# Patient Record
Sex: Male | Born: 1970 | Race: White | Hispanic: No | Marital: Single | Smoking: Former smoker
Health system: Southern US, Community
[De-identification: ages and names within clinical notes are randomized; demographics above are authoritative.]

## PROBLEM LIST (undated history)

## (undated) ENCOUNTER — Emergency Department (HOSPITAL_COMMUNITY): Disposition: A | Discharge status: Home / Self Care | Payer: 59

## (undated) DIAGNOSIS — K259 Gastric ulcer, unspecified as acute or chronic, without hemorrhage or perforation: Secondary | ICD-10-CM

## (undated) DIAGNOSIS — K589 Irritable bowel syndrome without diarrhea: Secondary | ICD-10-CM

## (undated) DIAGNOSIS — K449 Diaphragmatic hernia without obstruction or gangrene: Secondary | ICD-10-CM

## (undated) DIAGNOSIS — E785 Hyperlipidemia, unspecified: Secondary | ICD-10-CM

## (undated) DIAGNOSIS — K8020 Calculus of gallbladder without cholecystitis without obstruction: Secondary | ICD-10-CM

## (undated) DIAGNOSIS — R42 Dizziness and giddiness: Principal | ICD-10-CM

## (undated) DIAGNOSIS — S32010A Wedge compression fracture of first lumbar vertebra, initial encounter for closed fracture: Secondary | ICD-10-CM

## (undated) DIAGNOSIS — E162 Hypoglycemia, unspecified: Secondary | ICD-10-CM

## (undated) DIAGNOSIS — K219 Gastro-esophageal reflux disease without esophagitis: Secondary | ICD-10-CM

## (undated) DIAGNOSIS — N159 Renal tubulo-interstitial disease, unspecified: Secondary | ICD-10-CM

## (undated) DIAGNOSIS — S129XXA Fracture of neck, unspecified, initial encounter: Secondary | ICD-10-CM

## (undated) DIAGNOSIS — Z8719 Personal history of other diseases of the digestive system: Secondary | ICD-10-CM

## (undated) DIAGNOSIS — F419 Anxiety disorder, unspecified: Secondary | ICD-10-CM

## (undated) DIAGNOSIS — M419 Scoliosis, unspecified: Secondary | ICD-10-CM

## (undated) DIAGNOSIS — G43019 Migraine without aura, intractable, without status migrainosus: Secondary | ICD-10-CM

## (undated) HISTORY — DX: Hypoglycemia, unspecified: E16.2

## (undated) HISTORY — DX: Anxiety disorder, unspecified: F41.9

## (undated) HISTORY — DX: Fracture of neck, unspecified, initial encounter: S12.9XXA

## (undated) HISTORY — DX: Personal history of other diseases of the digestive system: Z87.19

## (undated) HISTORY — DX: Calculus of gallbladder without cholecystitis without obstruction: K80.20

## (undated) HISTORY — DX: Renal tubulo-interstitial disease, unspecified: N15.9

## (undated) HISTORY — DX: Hyperlipidemia, unspecified: E78.5

## (undated) HISTORY — DX: Gastro-esophageal reflux disease without esophagitis: K21.9

## (undated) HISTORY — PX: CHOLECYSTECTOMY: SHX55

## (undated) HISTORY — PX: KNEE SURGERY: SHX244

## (undated) HISTORY — PX: HERNIA REPAIR: SHX51

## (undated) HISTORY — DX: Migraine without aura, intractable, without status migrainosus: G43.019

## (undated) HISTORY — DX: Gastric ulcer, unspecified as acute or chronic, without hemorrhage or perforation: K25.9

## (undated) HISTORY — DX: Dizziness and giddiness: R42

## (undated) HISTORY — DX: Diaphragmatic hernia without obstruction or gangrene: K44.9

## (undated) HISTORY — DX: Irritable bowel syndrome, unspecified: K58.9

## (undated) HISTORY — DX: Wedge compression fracture of first lumbar vertebra, initial encounter for closed fracture: S32.010A

## (undated) HISTORY — DX: Scoliosis, unspecified: M41.9

## (undated) HISTORY — PX: OTHER SURGICAL HISTORY: SHX169

## (undated) HISTORY — PX: KIDNEY STONE SURGERY: SHX686

---

## 2006-11-19 ENCOUNTER — Ambulatory Visit (HOSPITAL_BASED_OUTPATIENT_CLINIC_OR_DEPARTMENT_OTHER): Admission: RE | Admit: 2006-11-19 | Discharge: 2006-11-19 | Payer: Self-pay | Admitting: Urology

## 2007-05-21 ENCOUNTER — Emergency Department (HOSPITAL_COMMUNITY): Admission: EM | Admit: 2007-05-21 | Discharge: 2007-05-21 | Payer: Self-pay | Admitting: Emergency Medicine

## 2008-04-13 ENCOUNTER — Ambulatory Visit: Payer: Self-pay | Admitting: Cardiology

## 2008-04-22 ENCOUNTER — Encounter: Payer: Self-pay | Admitting: Cardiology

## 2008-04-22 ENCOUNTER — Ambulatory Visit: Payer: Self-pay

## 2008-07-28 ENCOUNTER — Encounter: Admission: RE | Admit: 2008-07-28 | Discharge: 2008-07-28 | Payer: Self-pay | Admitting: Family Medicine

## 2011-05-02 NOTE — Assessment & Plan Note (Signed)
[facility]                            CARDIOLOGY OFFICE NOTE   KHYRON, [name]                      MRN:          [id number]  DATE:04/13/2008                            DOB:          December 08, 1971    REFERRING PHYSICIAN:  Ernestina Penna, M.D.   REASON FOR CONSULTATION:  Chest pain.   HISTORY OF PRESENT ILLNESS:  Mr. Derrick Wheeler is a 40 year old male with a  history of tobacco abuse and some degree of cardiovascular disease  within the family affecting both parents in their 38s. He has no  personal history of hypertension, diabetes mellitus or cardiovascular  disease.  I see based on old the records that he underwent a previous  stress test in 2006 at an outside facility that was reportedly normal  per discussion with the patient.  He was having some chest pain at that  time that was ultimately felt to be musculoskeletal.  I also see that he  was arranged to be seen in our [place] office at that time, although he did  not come in for this visit.   He is now referred with a 4-week history of intermittent chest  discomfort.  He describes this as both a fluttering and a tight chest  discomfort that lasts anywhere from seconds to minutes, typically in the  left lower thoracic/costal area.  He states that this is most noted when  he is emotionally upset or under stress, although it has been sporadic  and unpredictable at other times.  He does not note any clear exertional  component to this.  He states he has not been exercising regularly.  He  has had no follow-up ischemic testing since 2006. Electrocardiogram  today is normal showing sinus rhythm at 77 beats per minute.   ALLERGIES:  OXYCONTIN and LATEX.   MEDICATIONS:  Ibuprofen 200 mg p.o. b.i.d.   PAST MEDICAL HISTORY:  As outlined above. He reports back problems.  He  has had previous surgery on the left wrist, left knee, right knee, left  testicle.  He had an epidural injection in his lower spine  in 2003.   REVIEW OF SYSTEMS:  As outlined above.  Otherwise negative.   SOCIAL HISTORY:  The patient is single.  He has one child.  He smokes a  pack per day tobacco and has done this for the last 4 years although he  was able to quit in the past.  He drinks 1 or 2 alcoholic beverages a  week, one cup of coffee a day.  He is not exercising regularly.   FAMILY HISTORY:  Significant for cardiovascular disease affecting both  mother and father in their 64s.  He also states that he has a brother in  his 50s with some type of cardiac condition.   PHYSICAL EXAMINATION:  Blood pressure is 110/84, heart rate 77, weight  190 pounds.  The patient is well developed in no acute distress.  HEENT:  Conjunctiva, lids normal.  Pharynx clear.  NECK:  Supple.  No elevated venous pressure. No loud bruits, no  thyromegaly is noted.  LUNGS:  Clear without labored breathing at rest.  CARDIAC:  Regular rate and rhythm.  No loud murmur or gallop.  ABDOMEN:  Soft, nontender, normal active bowel sounds.  EXTREMITIES:  No significant pitting edema.  Distal pulses are of 2+.  SKIN:  Warm and dry.  MUSCULOSKELETAL:  No kyphosis noted.  NEUROPSYCHIATRIC:  The patient alert and oriented x3.  Affect is  appropriate.   IMPRESSION/RECOMMENDATIONS:  Chest pain syndrome as outlined above in a  40 year old male with ongoing tobacco abuse and some degree of family  cardiovascular history.  His electrocardiogram at rest is normal.  He  has not had any follow-up ischemic evaluation since 2006, at that time  reportedly normal.  He is interested in resuming an exercise regiment.  I spoke with him about smoking cessation and also ultimately following  up for a lipid profile with Dr. Christell Constant.  I think an exercise  echocardiogram would be reasonable for further risk stratification and  this will be arranged. We will inform them of the results and can  proceed from there.     Jonelle Sidle, MD  Electronically  Signed    SGM/MedQ  DD: 04/13/2008  DT: 04/13/2008  Job #: [id number]

## 2011-05-05 NOTE — Op Note (Signed)
NAME:  Derrick Wheeler, Derrick Wheeler NO.:  [id number]   MEDICAL RECORD NO.:  [id number]          PATIENT TYPE:  AMB   LOCATION:  [facility]                         FACILITY:  [facility]   PHYSICIAN:  Maretta Bees. Vonita Moss, M.D.DATE OF BIRTH:  18-Nov-1971   DATE OF PROCEDURE:  11/19/2006  DATE OF DISCHARGE:                               OPERATIVE REPORT   PREOPERATIVE DIAGNOSIS:  Right spermatocele.   POSTOPERATIVE DIAGNOSIS:  Right spermatocele.   PROCEDURE:  Right partial epididymectomy.   SURGEON:  Maretta Bees. Vonita Moss, M.D.   ANESTHESIA:  General.   INDICATIONS:  This 40 year old gentleman has had recurrent pain and  tenderness from a small spermatocele in the upper pole of right appeared  epididymis.  He has been unresponsive to medical and conservative  therapy and wishes resection of this lesion.   PROCEDURE:  The patient was brought to the operating room and placed in  the supine position.  The external genitalia were prepped and draped in  the usual fashion.  A right scrotal incision was made and the testicle  delivered into the operative field, and the tunica vaginalis incised and  retracted behind the testicle. The cystic-feeling mass in the upper pole  of the right epididymis was palpated, and the upper pole was dissected  sharply off of the top of the epididymis with hemostasis using  electrocautery.  A segment of epididymis about an inch long was then  transected that included resection of the spermatocele the resected  epididymis was oversewn with running 3-0 chromic catgut, then this  epididymal remnant sutured over the previous resection site.  The tunica  vaginalis was sutured behind the testicle with 3-0 chromic catgut.  A  couple other tiny bleeders were electrocoagulated. At this point, the  scrotal wound was closed with a running vertical mattress suture of 3-0  chromic catgut with some reinforcing isolated sutures of 3-0 chromic  catgut.  The wound was then injected  with Marcaine, and collodion put on  the sutures.  He was taken to the recovery room in good condition, with  essentially minimal if any blood loss, and having tolerated the  procedure well.      Maretta Bees. Vonita Moss, M.D.  Electronically Signed     LJP/MEDQ  D:  11/19/2006  T:  11/20/2006  Job:  [id number]

## 2011-10-05 LAB — DIFFERENTIAL
Basophils Absolute: 0
Basophils Relative: 1
Eosinophils Absolute: 0.3
Eosinophils Relative: 4
Lymphocytes Relative: 28
Lymphs Abs: 1.9
Monocytes Absolute: 0.6
Monocytes Relative: 8
Neutro Abs: 4
Neutrophils Relative %: 59

## 2011-10-05 LAB — I-STAT 8, (EC8 V) (CONVERTED LAB)
Acid-base deficit: 1
BUN: 12
Bicarbonate: 25.1 — ABNORMAL HIGH
Chloride: 106
Glucose, Bld: 91
HCT: 46
Hemoglobin: 15.6
Operator id: 189501
Potassium: 3.7
Sodium: 139
TCO2: 26
pCO2, Ven: 44.9 — ABNORMAL LOW
pH, Ven: 7.355 — ABNORMAL HIGH

## 2011-10-05 LAB — CBC
HCT: 44.4
Hemoglobin: 14.9
MCHC: 33.6
MCV: 86.8
Platelets: 230
RBC: 5.12
RDW: 13.6
WBC: 6.8

## 2011-10-05 LAB — POCT CARDIAC MARKERS
CKMB, poc: <1 — ABNORMAL LOW
Myoglobin, poc: 30.6
Operator id: 189501
Troponin i, poc: <0.05

## 2011-10-05 LAB — POCT I-STAT CREATININE
Creatinine, Ser: 1.1
Operator id: 189501

## 2011-10-05 LAB — D-DIMER, QUANTITATIVE: D-Dimer, Quant: <0.22

## 2013-03-18 ENCOUNTER — Ambulatory Visit (INDEPENDENT_AMBULATORY_CARE_PROVIDER_SITE_OTHER): Payer: 59 | Admitting: General Practice

## 2013-03-18 ENCOUNTER — Ambulatory Visit (INDEPENDENT_AMBULATORY_CARE_PROVIDER_SITE_OTHER): Payer: 59

## 2013-03-18 VITALS — BP 126/68 | HR 72 | Temp 98.2°F | Wt 210.0 lb

## 2013-03-18 DIAGNOSIS — R059 Cough, unspecified: Secondary | ICD-10-CM

## 2013-03-18 DIAGNOSIS — R05 Cough: Secondary | ICD-10-CM

## 2013-03-18 DIAGNOSIS — R058 Other specified cough: Secondary | ICD-10-CM

## 2013-03-18 DIAGNOSIS — J329 Chronic sinusitis, unspecified: Secondary | ICD-10-CM

## 2013-03-18 MED ORDER — BENZONATATE 100 MG PO CAPS: 100.0000 mg | ORAL_CAPSULE | Freq: Two times a day (BID) | ORAL | Status: DC | PRN

## 2013-03-18 MED ORDER — AZITHROMYCIN 250 MG PO TABS: ORAL_TABLET | ORAL | Status: DC

## 2013-03-18 MED ORDER — CEFTRIAXONE SODIUM 1 G IJ SOLR: 1.0000 g | Freq: Once | INTRAMUSCULAR | Status: DC

## 2013-03-18 MED ORDER — CEFTRIAXONE SODIUM 1 G IJ SOLR
1.0000 g | Freq: Once | INTRAMUSCULAR | Status: AC
  Administered 2013-03-18: 1 g via INTRAMUSCULAR

## 2013-03-18 MED ORDER — AZITHROMYCIN 250 MG PO TABS
ORAL_TABLET | ORAL | Status: DC
Start: 2013-03-18 — End: 2013-03-18

## 2013-03-18 NOTE — Patient Instructions (Addendum)
Sinusitis Sinusitis is redness, soreness, and swelling (inflammation) of the paranasal sinuses. Paranasal sinuses are air pockets within the bones of your face (beneath the eyes, the middle of the forehead, or above the eyes). In healthy paranasal sinuses, mucus is able to drain out, and air is able to circulate through them by way of your nose. However, when your paranasal sinuses are inflamed, mucus and air can become trapped. This can allow bacteria and other germs to grow and cause infection. Sinusitis can develop quickly and last only a short time (acute) or continue over a long period (chronic). Sinusitis that lasts for more than 12 weeks is considered chronic.  CAUSES  Causes of sinusitis include:  Allergies.  Structural abnormalities, such as displacement of the cartilage that separates your nostrils (deviated septum), which can decrease the air flow through your nose and sinuses and affect sinus drainage.  Functional abnormalities, such as when the small hairs (cilia) that line your sinuses and help remove mucus do not work properly or are not present. SYMPTOMS  Symptoms of acute and chronic sinusitis are the same. The primary symptoms are pain and pressure around the affected sinuses. Other symptoms include:  Upper toothache.  Earache.  Headache.  Bad breath.  Decreased sense of smell and taste.  A cough, which worsens when you are lying flat.  Fatigue.  Fever.  Thick drainage from your nose, which often is green and may contain pus (purulent).  Swelling and warmth over the affected sinuses. DIAGNOSIS  Your caregiver will perform a physical exam. During the exam, your caregiver may:  Look in your nose for signs of abnormal growths in your nostrils (nasal polyps).  Tap over the affected sinus to check for signs of infection.  View the inside of your sinuses (endoscopy) with a special imaging device with a light attached (endoscope), which is inserted into your  sinuses. If your caregiver suspects that you have chronic sinusitis, one or more of the following tests may be recommended:  Allergy tests.  Nasal culture A sample of mucus is taken from your nose and sent to a lab and screened for bacteria.  Nasal cytology A sample of mucus is taken from your nose and examined by your caregiver to determine if your sinusitis is related to an allergy. TREATMENT  Most cases of acute sinusitis are related to a viral infection and will resolve on their own within 10 days. Sometimes medicines are prescribed to help relieve symptoms (pain medicine, decongestants, nasal steroid sprays, or saline sprays).  However, for sinusitis related to a bacterial infection, your caregiver will prescribe antibiotic medicines. These are medicines that will help kill the bacteria causing the infection.  Rarely, sinusitis is caused by a fungal infection. In theses cases, your caregiver will prescribe antifungal medicine. For some cases of chronic sinusitis, surgery is needed. Generally, these are cases in which sinusitis recurs more than 3 times per year, despite other treatments. HOME CARE INSTRUCTIONS   Drink plenty of water. Water helps thin the mucus so your sinuses can drain more easily.  Use a humidifier.  Inhale steam 3 to 4 times a day (for example, sit in the bathroom with the shower running).  Apply a warm, moist washcloth to your face 3 to 4 times a day, or as directed by your caregiver.  Use saline nasal sprays to help moisten and clean your sinuses.  Take over-the-counter or prescription medicines for pain, discomfort, or fever only as directed by your caregiver. SEEK IMMEDIATE MEDICAL   CARE IF:  You have increasing pain or severe headaches.  You have nausea, vomiting, or drowsiness.  You have swelling around your face.  You have vision problems.  You have a stiff neck.  You have difficulty breathing. MAKE SURE YOU:   Understand these  instructions.  Will watch your condition.  Will get help right away if you are not doing well or get worse. Document Released: 12/04/2005 Document Revised: 02/26/2012 Document Reviewed: 12/19/2011 [facility] Patient Information 2013 [place], [place]. Cough, Adult  A cough is a reflex that helps clear your throat and airways. It can help heal the body or may be a reaction to an irritated airway. A cough may only last 2 or 3 weeks (acute) or may last more than 8 weeks (chronic).  CAUSES Acute cough:  Viral or bacterial infections. Chronic cough:  Infections.  Allergies.  Asthma.  Post-nasal drip.  Smoking.  Heartburn or acid reflux.  Some medicines.  Chronic lung problems (COPD).  Cancer. SYMPTOMS   Cough.  Fever.  Chest pain.  Increased breathing rate.  High-pitched whistling sound when breathing (wheezing).  Colored mucus that you cough up (sputum). TREATMENT   A bacterial cough may be treated with antibiotic medicine.  A viral cough must run its course and will not respond to antibiotics.  Your caregiver may recommend other treatments if you have a chronic cough. HOME CARE INSTRUCTIONS   Only take over-the-counter or prescription medicines for pain, discomfort, or fever as directed by your caregiver. Use cough suppressants only as directed by your caregiver.  Use a cold steam vaporizer or humidifier in your bedroom or home to help loosen secretions.  Sleep in a semi-upright position if your cough is worse at night.  Rest as needed.  Stop smoking if you smoke. SEEK IMMEDIATE MEDICAL CARE IF:   You have pus in your sputum.  Your cough starts to worsen.  You cannot control your cough with suppressants and are losing sleep.  You begin coughing up blood.  You have difficulty breathing.  You develop pain which is getting worse or is uncontrolled with medicine.  You have a fever. MAKE SURE YOU:   Understand these instructions.  Will watch your  condition.  Will get help right away if you are not doing well or get worse. Document Released: 06/02/2011 Document Revised: 02/26/2012 Document Reviewed: 06/02/2011 [facility] Patient Information 2013 [place], [place].  Smoking Cessation Quitting smoking is important to your health and has many advantages. However, it is not always easy to quit since nicotine is a very addictive drug. Often times, people try 3 times or more before being able to quit. This document explains the best ways for you to prepare to quit smoking. Quitting takes hard work and a lot of effort, but you can do it. ADVANTAGES OF QUITTING SMOKING  You will live longer, feel better, and live better.  Your body will feel the impact of quitting smoking almost immediately.  Within 20 minutes, blood pressure decreases. Your pulse returns to its normal level.  After 8 hours, carbon monoxide levels in the blood return to normal. Your oxygen level increases.  After 24 hours, the chance of having a heart attack starts to decrease. Your breath, hair, and body stop smelling like smoke.  After 48 hours, damaged nerve endings begin to recover. Your sense of taste and smell improve.  After 72 hours, the body is virtually free of nicotine. Your bronchial tubes relax and breathing becomes easier.  After 2 to 12  weeks, lungs can hold more air. Exercise becomes easier and circulation improves.  The risk of having a heart attack, stroke, cancer, or lung disease is greatly reduced.  After 1 year, the risk of coronary heart disease is cut in half.  After 5 years, the risk of stroke falls to the same as a nonsmoker.  After 10 years, the risk of lung cancer is cut in half and the risk of other cancers decreases significantly.  After 15 years, the risk of coronary heart disease drops, usually to the level of a nonsmoker.  If you are pregnant, quitting smoking will improve your chances of having a healthy baby.  The people you live  with, especially any children, will be healthier.  You will have extra money to spend on things other than cigarettes. QUESTIONS TO THINK ABOUT BEFORE ATTEMPTING TO QUIT You may want to talk about your answers with your caregiver.  Why do you want to quit?  If you tried to quit in the past, what helped and what did not?  What will be the most difficult situations for you after you quit? How will you plan to handle them?  Who can help you through the tough times? Your family? Friends? A caregiver?  What pleasures do you get from smoking? What ways can you still get pleasure if you quit? Here are some questions to ask your caregiver:  How can you help me to be successful at quitting?  What medicine do you think would be best for me and how should I take it?  What should I do if I need more help?  What is smoking withdrawal like? How can I get information on withdrawal? GET READY  Set a quit date.  Change your environment by getting rid of all cigarettes, ashtrays, matches, and lighters in your home, car, or work. Do not let people smoke in your home.  Review your past attempts to quit. Think about what worked and what did not. GET SUPPORT AND ENCOURAGEMENT You have a better chance of being successful if you have help. You can get support in many ways.  Tell your family, friends, and co-workers that you are going to quit and need their support. Ask them not to smoke around you.  Get individual, group, or telephone counseling and support. Programs are available at [employer] and health centers. Call your local health department for information about programs in your area.  Spiritual beliefs and practices may help some smokers quit.  Download a "quit meter" on your computer to keep track of quit statistics, such as how long you have gone without smoking, cigarettes not smoked, and money saved.  Get a self-help book about quitting smoking and staying off of tobacco. LEARN  NEW SKILLS AND BEHAVIORS  Distract yourself from urges to smoke. Talk to someone, go for a walk, or occupy your time with a task.  Change your normal routine. Take a different route to work. Drink tea instead of coffee. Eat breakfast in a different place.  Reduce your stress. Take a hot bath, exercise, or read a book.  Plan something enjoyable to do every day. Reward yourself for not smoking.  Explore interactive web-based programs that specialize in helping you quit. GET MEDICINE AND USE IT CORRECTLY Medicines can help you stop smoking and decrease the urge to smoke. Combining medicine with the above behavioral methods and support can greatly increase your chances of successfully quitting smoking.  Nicotine replacement therapy helps deliver nicotine to your  body without the negative effects and risks of smoking. Nicotine replacement therapy includes nicotine gum, lozenges, inhalers, nasal sprays, and skin patches. Some may be available over-the-counter and others require a prescription.  Antidepressant medicine helps people abstain from smoking, but how this works is unknown. This medicine is available by prescription.  Nicotinic receptor partial agonist medicine simulates the effect of nicotine in your brain. This medicine is available by prescription. Ask your caregiver for advice about which medicines to use and how to use them based on your health history. Your caregiver will tell you what side effects to look out for if you choose to be on a medicine or therapy. Carefully read the information on the package. Do not use any other product containing nicotine while using a nicotine replacement product.  RELAPSE OR DIFFICULT SITUATIONS Most relapses occur within the first 3 months after quitting. Do not be discouraged if you start smoking again. Remember, most people try several times before finally quitting. You may have symptoms of withdrawal because your body is used to nicotine. You may  crave cigarettes, be irritable, feel very hungry, cough often, get headaches, or have difficulty concentrating. The withdrawal symptoms are only temporary. They are strongest when you first quit, but they will go away within 10 14 days. To reduce the chances of relapse, try to:  Avoid drinking alcohol. Drinking lowers your chances of successfully quitting.  Reduce the amount of caffeine you consume. Once you quit smoking, the amount of caffeine in your body increases and can give you symptoms, such as a rapid heartbeat, sweating, and anxiety.  Avoid smokers because they can make you want to smoke.  Do not let weight gain distract you. Many smokers will gain weight when they quit, usually less than 10 pounds. Eat a healthy diet and stay active. You can always lose the weight gained after you quit.  Find ways to improve your mood other than smoking. FOR MORE INFORMATION  www.smokefree.gov  Document Released: 11/28/2001 Document Revised: 06/04/2012 Document Reviewed: 03/14/2012 [facility] Patient Information 2013 [place], [place].

## 2013-03-18 NOTE — Progress Notes (Signed)
  Subjective:    Patient ID: Derrick Wheeler, male    DOB: 06-29-1971, 42 y.o.   MRN: [id number]  HPI Presents today with headache, chest congestion, cough, and facial pressure times one to two weeks. Productive sputum light brown-green. Facial pressure over forehead and cheek area. OTC medications not taken, only vitamin C. Denies fever    Review of Systems  Constitutional: Negative for fever, chills, activity change and appetite change.  Respiratory: Positive for chest tightness and stridor. Negative for shortness of breath.   Cardiovascular: Negative for chest pain and palpitations.  Genitourinary: Negative for difficulty urinating.  Musculoskeletal: Positive for myalgias.  Skin: Negative.  Negative for rash.  Neurological: Positive for dizziness, light-headedness and headaches.       With coughing  Psychiatric/Behavioral: Negative.        Objective:   Physical Exam  Constitutional: He is oriented to person, place, and time. He appears well-developed and well-nourished.  HENT:  Head: Normocephalic and atraumatic.  Right Ear: External ear normal.  Left Ear: External ear normal.  Nose: Right sinus exhibits maxillary sinus tenderness and frontal sinus tenderness. Left sinus exhibits maxillary sinus tenderness and frontal sinus tenderness.  Mouth/Throat: Posterior oropharyngeal erythema present.  Cardiovascular: Normal rate, regular rhythm and normal heart sounds.   No murmur heard. Pulmonary/Chest: Effort normal and breath sounds normal.  Abdominal: Soft. Bowel sounds are normal.  Neurological: He is alert and oriented to person, place, and time.  Skin: Skin is warm and dry.  Psychiatric: He has a normal mood and affect.    [facility] reading (PRIMARY) by Ruthell Rummage, FNP-C, no acute disease process                                    Assessment & Plan:  Rocephin 1gram given IM Continue oral antibiotics even if feeling better Increase fluid intake tylenol OTC OTC  decongestant Proper handwashing Rest  RTO if symptoms or unresolved Patient verbalized understanding and denies any questions    Raymon Mutton, FNP-C

## 2013-09-30 ENCOUNTER — Ambulatory Visit (INDEPENDENT_AMBULATORY_CARE_PROVIDER_SITE_OTHER): Payer: 59 | Admitting: Family Medicine

## 2013-09-30 ENCOUNTER — Ambulatory Visit (INDEPENDENT_AMBULATORY_CARE_PROVIDER_SITE_OTHER): Payer: 59

## 2013-09-30 ENCOUNTER — Encounter: Payer: Self-pay | Admitting: Family Medicine

## 2013-09-30 VITALS — BP 111/76 | HR 86 | Temp 98.2°F | Ht 74.0 in | Wt 207.0 lb

## 2013-09-30 DIAGNOSIS — S1096XA Insect bite of unspecified part of neck, initial encounter: Secondary | ICD-10-CM

## 2013-09-30 DIAGNOSIS — W57XXXA Bitten or stung by nonvenomous insect and other nonvenomous arthropods, initial encounter: Secondary | ICD-10-CM

## 2013-09-30 DIAGNOSIS — S00462A Insect bite (nonvenomous) of left ear, initial encounter: Secondary | ICD-10-CM

## 2013-09-30 DIAGNOSIS — R51 Headache: Secondary | ICD-10-CM

## 2013-09-30 DIAGNOSIS — M542 Cervicalgia: Secondary | ICD-10-CM

## 2013-09-30 MED ORDER — CEPHALEXIN 500 MG PO CAPS: 500.0000 mg | ORAL_CAPSULE | Freq: Three times a day (TID) | ORAL | Status: DC

## 2013-09-30 MED ORDER — NAPROXEN 500 MG PO TABS: 500.0000 mg | ORAL_TABLET | Freq: Two times a day (BID) | ORAL | Status: DC

## 2013-09-30 NOTE — Patient Instructions (Signed)
Continue current medications. Continue good therapeutic lifestyle changes.  Fall precautions discussed with patient. Follow up as planned and earlier as needed.   
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
## 2013-12-23 ENCOUNTER — Telehealth: Payer: Self-pay | Admitting: Family Medicine

## 2013-12-23 NOTE — Telephone Encounter (Signed)
Appt at 4 with moore 1/7

## 2013-12-24 ENCOUNTER — Encounter: Payer: Self-pay | Admitting: Family Medicine

## 2013-12-24 ENCOUNTER — Ambulatory Visit (INDEPENDENT_AMBULATORY_CARE_PROVIDER_SITE_OTHER): Payer: 59 | Admitting: Family Medicine

## 2013-12-24 VITALS — BP 101/74 | HR 103 | Temp 97.8°F | Ht 74.0 in | Wt 208.0 lb

## 2013-12-24 DIAGNOSIS — L738 Other specified follicular disorders: Secondary | ICD-10-CM

## 2013-12-24 DIAGNOSIS — B350 Tinea barbae and tinea capitis: Secondary | ICD-10-CM

## 2013-12-24 MED ORDER — SULFAMETHOXAZOLE-TMP DS 800-160 MG PO TABS: 1.0000 | ORAL_TABLET | Freq: Two times a day (BID) | ORAL | Status: DC

## 2013-12-24 NOTE — Progress Notes (Addendum)
   Subjective:    Patient ID: Derrick Wheeler, male    DOB: [birth year], 43 y.o.   MRN: [id number]  HPI Patient here today for infected area on face. The patient has had problems with this same area in the past and has had to take antibiotics.       There are no active problems to display for this patient.  Outpatient Encounter Prescriptions as of 12/24/2013  Medication Sig  . [DISCONTINUED] cephALEXin (KEFLEX) 500 MG capsule Take 1 capsule (500 mg total) by mouth 3 (three) times daily.  . [DISCONTINUED] naproxen (NAPROSYN) 500 MG tablet Take 1 tablet (500 mg total) by mouth 2 (two) times daily with a meal.    Review of Systems  Constitutional: Negative.   HENT: Negative.   Eyes: Negative.   Respiratory: Negative.   Cardiovascular: Negative.   Gastrointestinal: Negative.   Endocrine: Negative.   Genitourinary: Negative.   Musculoskeletal: Negative.   Skin: Negative.        Infected area on face  Allergic/Immunologic: Negative.   Neurological: Negative.   Hematological: Negative.   Psychiatric/Behavioral: Negative.        Objective:   Physical Exam  Constitutional: He is oriented to person, place, and time. He appears well-developed and well-nourished. No distress.  HENT:  Head: Normocephalic.  Eyes: Conjunctivae and EOM are normal. Pupils are equal, round, and reactive to light. Right eye exhibits no discharge. Left eye exhibits no discharge. No scleral icterus.  Neck: Normal range of motion. Neck supple. No thyromegaly present.  Musculoskeletal: Normal range of motion.  Lymphadenopathy:    He has no cervical adenopathy.  Neurological: He is alert and oriented to person, place, and time.  Skin: Skin is warm and dry. No rash noted. No pallor.  2 areas of inflammation 1 that is draining yellow material at the angle of the jaw or mandible in the anterior cervical area on the left side. The tissue is thickened in this area. There is minimal erythema.  Psychiatric: He has a  normal mood and affect. His behavior is normal. Judgment and thought content normal.   BP 101/74  Pulse 103  Temp(Src) 97.8 F (36.6 C) (Oral)  Ht 6\' 2"  (1.88 m)  Wt 208 lb (94.348 kg)  BMI 26.69 kg/m2        Assessment & Plan:  1. Folliculitis barbae - sulfamethoxazole-trimethoprim (BACTRIM DS) 800-160 MG per tablet; Take 1 tablet by mouth 2 (two) times daily.  Dispense: 60 tablet; Refill: 1  Patient Instructions  Use warm wet compresses followed by peroxide 2 or 3 times a day Take antibiotic hysterectomy with food We will arrange a followup appointment with a dermatologist in 3-4 weeks When you see the dermatologist, make sure you take a history of the culture that we're doing today and the antibiotic that you have been taking     Nyra Capeson W. Sukhman Kocher MD

## 2013-12-24 NOTE — Addendum Note (Signed)
Addended by: Orma RenderHODGES, Isael Stille F on: 12/24/2013 05:09 PM   Modules accepted: Orders

## 2013-12-24 NOTE — Patient Instructions (Addendum)
Use warm wet compresses followed by peroxide 2 or 3 times a day Take antibiotic hysterectomy with food We will arrange a followup appointment with a dermatologist in 3-4 weeks When you see the dermatologist, make sure you take a history of the culture that we're doing today and the antibiotic that you have been taking

## 2013-12-24 NOTE — Addendum Note (Signed)
Addended by: Magdalene RiverBULLINS, Sholonda Jobst H on: 12/24/2013 05:03 PM   Modules accepted: Orders

## 2013-12-26 LAB — AEROBIC CULTURE

## 2014-01-17 ENCOUNTER — Encounter: Payer: Self-pay | Admitting: Nurse Practitioner

## 2014-01-17 ENCOUNTER — Ambulatory Visit (INDEPENDENT_AMBULATORY_CARE_PROVIDER_SITE_OTHER): Payer: 59 | Admitting: Nurse Practitioner

## 2014-01-17 VITALS — BP 109/73 | HR 70 | Temp 97.3°F | Ht 74.0 in | Wt 209.0 lb

## 2014-01-17 DIAGNOSIS — L500 Allergic urticaria: Secondary | ICD-10-CM

## 2014-01-17 MED ORDER — METHYLPREDNISOLONE ACETATE 80 MG/ML IJ SUSP
80.0000 mg | Freq: Once | INTRAMUSCULAR | Status: AC
  Administered 2014-01-17: 80 mg via INTRAMUSCULAR

## 2014-01-17 NOTE — Patient Instructions (Signed)

## 2014-01-17 NOTE — Progress Notes (Signed)
   Subjective:    Patient ID: Derrick Wheeler, male    DOB: [birth year], 43 y.o.   MRN: [id number]  HPI Patient has been on sulfa for 3 weeks focillitis fro several days- Derrick Wheeler called in the middle of night saying Derrick Wheeler forgot to take one yesterday morning and took 2 within 2 hours yeasterday and woke up in the middle of night with rash. Very itchy    Review of Systems  Constitutional: Negative.   HENT: Negative.   Respiratory: Negative.   Cardiovascular: Negative.   Skin: Negative.   Neurological: Negative.   All other systems reviewed and are negative.       Objective:   Physical Exam  Constitutional: Derrick Wheeler is oriented to person, place, and time. Derrick Wheeler appears well-developed and well-nourished.  Cardiovascular: Normal rate, regular rhythm and normal heart sounds.   Pulmonary/Chest: Effort normal and breath sounds normal.  Abdominal: Soft. Bowel sounds are normal.  Neurological: Derrick Wheeler is alert and oriented to person, place, and time.  Skin: Skin is warm.  Fine erythematous itchy rash all over body  Psychiatric: Derrick Wheeler has a normal mood and affect. His behavior is normal. Judgment and thought content normal.    BP 109/73  Pulse 70  Temp(Src) 97.3 F (36.3 C) (Oral)  Ht 6\' 2"  (1.88 m)  Wt 209 lb (94.802 kg)  BMI 26.82 kg/m2       Assessment & Plan:   1. Allergic urticaria    Meds ordered this encounter  Medications  . methylPREDNISolone acetate (DEPO-MEDROL) injection 80 mg    Sig:    Benadryl OTC Follow up if no better  Mary-Margaret Daphine DeutscherMartin, FNP

## 2014-02-18 ENCOUNTER — Ambulatory Visit (INDEPENDENT_AMBULATORY_CARE_PROVIDER_SITE_OTHER): Payer: 59 | Admitting: Family Medicine

## 2014-02-18 ENCOUNTER — Encounter: Payer: Self-pay | Admitting: Family Medicine

## 2014-02-18 ENCOUNTER — Ambulatory Visit (INDEPENDENT_AMBULATORY_CARE_PROVIDER_SITE_OTHER): Payer: 59

## 2014-02-18 VITALS — BP 101/70 | HR 93 | Temp 98.0°F | Ht 74.0 in | Wt 208.0 lb

## 2014-02-18 DIAGNOSIS — R079 Chest pain, unspecified: Secondary | ICD-10-CM

## 2014-02-18 DIAGNOSIS — R5383 Other fatigue: Secondary | ICD-10-CM

## 2014-02-18 DIAGNOSIS — K219 Gastro-esophageal reflux disease without esophagitis: Secondary | ICD-10-CM

## 2014-02-18 DIAGNOSIS — R0789 Other chest pain: Secondary | ICD-10-CM

## 2014-02-18 DIAGNOSIS — R5381 Other malaise: Secondary | ICD-10-CM
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
## 2014-02-18 NOTE — Patient Instructions (Signed)
Discontinue caffeine Heavy lifting--- avoid Use warm wet compresses to back Stay away from fried greasy and highly spiced food Take Zantac 150 twice daily before breakfast and supper on a regular basis  Return to clinic and get lab work We will arrange for you to get a stress test Return the FOBT

## 2014-02-19 ENCOUNTER — Other Ambulatory Visit (INDEPENDENT_AMBULATORY_CARE_PROVIDER_SITE_OTHER): Payer: 59

## 2014-02-19 ENCOUNTER — Encounter (INDEPENDENT_AMBULATORY_CARE_PROVIDER_SITE_OTHER): Payer: Self-pay

## 2014-02-19 DIAGNOSIS — R5383 Other fatigue: Secondary | ICD-10-CM

## 2014-02-19 DIAGNOSIS — R079 Chest pain, unspecified: Secondary | ICD-10-CM

## 2014-02-19 DIAGNOSIS — R0789 Other chest pain: Secondary | ICD-10-CM

## 2014-02-19 DIAGNOSIS — R5381 Other malaise: Secondary | ICD-10-CM

## 2014-02-19 LAB — POCT CBC
Granulocyte percent: 60.8 %G (ref 37–80)
HCT, POC: 47.4 % (ref 43.5–53.7)
HEMOGLOBIN: 16 g/dL (ref 14.1–18.1)
Lymph, poc: 2.9 (ref 0.6–3.4)
MCH: 29.8 pg (ref 27–31.2)
MCHC: 33.7 g/dL (ref 31.8–35.4)
MCV: 88.3 fL (ref 80–97)
MPV: 7.3 fL (ref 0–99.8)
POC GRANULOCYTE: 4.8 (ref 2–6.9)
POC LYMPH %: 36.2 % (ref 10–50)
Platelet Count, POC: 234 10*3/uL (ref 142–424)
RBC: 5.4 M/uL (ref 4.69–6.13)
RDW, POC: 13.6 %
WBC: 7.9 10*3/uL (ref 4.6–10.2)

## 2014-02-19 NOTE — Addendum Note (Signed)
Addended by: Gwenith DailyHUDY, KRISTEN N on: 02/19/2014 08:38 AM   Modules accepted: Orders

## 2014-02-20 ENCOUNTER — Encounter: Payer: Self-pay | Admitting: Internal Medicine

## 2014-02-22 LAB — HEPATIC FUNCTION PANEL
ALBUMIN: 4.5 g/dL (ref 3.5–5.5)
ALT: 12 IU/L (ref 0–44)
AST: 15 IU/L (ref 0–40)
Alkaline Phosphatase: 69 IU/L (ref 39–117)
BILIRUBIN DIRECT: 0.14 mg/dL (ref 0.00–0.40)
BILIRUBIN TOTAL: 0.7 mg/dL (ref 0.0–1.2)
TOTAL PROTEIN: 7 g/dL (ref 6.0–8.5)

## 2014-02-22 LAB — NMR, LIPOPROFILE
Cholesterol: 231 mg/dL — ABNORMAL HIGH (ref <200)
HDL Cholesterol by NMR: 41 mg/dL (ref >=40)
HDL Particle Number: 22.4 umol/L — ABNORMAL LOW (ref >=30.5)
LDL PARTICLE NUMBER: 2027 nmol/L — AB (ref <1000)
LDL Size: 21 nm (ref >20.5)
LDLC SERPL CALC-MCNC: 164 mg/dL — AB (ref <100)
LP-IR SCORE: 66 — AB (ref <=45)
Small LDL Particle Number: 833 nmol/L — ABNORMAL HIGH (ref <=527)
Triglycerides by NMR: 128 mg/dL (ref <150)

## 2014-02-22 LAB — VITAMIN D 25 HYDROXY (VIT D DEFICIENCY, FRACTURES): Vit D, 25-Hydroxy: 14 ng/mL — ABNORMAL LOW (ref 30.0–100.0)

## 2014-02-22 LAB — THYROID PANEL WITH TSH
Free Thyroxine Index: 2.1 (ref 1.2–4.9)
T3 UPTAKE RATIO: 31 % (ref 24–39)
T4 TOTAL: 6.8 ug/dL (ref 4.5–12.0)
TSH: 0.754 u[IU]/mL (ref 0.450–4.500)

## 2014-02-22 LAB — BMP8+EGFR
BUN/Creatinine Ratio: 14 (ref 9–20)
BUN: 16 mg/dL (ref 6–24)
CALCIUM: 9.8 mg/dL (ref 8.7–10.2)
CO2: 24 mmol/L (ref 18–29)
CREATININE: 1.13 mg/dL (ref 0.76–1.27)
Chloride: 99 mmol/L (ref 97–108)
GFR calc Af Amer: 92 mL/min/{1.73_m2} (ref >59)
GFR, EST NON AFRICAN AMERICAN: 79 mL/min/{1.73_m2} (ref >59)
GLUCOSE: 92 mg/dL (ref 65–99)
Potassium: 4.5 mmol/L (ref 3.5–5.2)
Sodium: 141 mmol/L (ref 134–144)

## 2014-02-22 LAB — TESTOSTERONE,FREE AND TOTAL
TESTOSTERONE FREE: 7.7 pg/mL (ref 6.8–21.5)
Testosterone: 557 ng/dL (ref 348–1197)

## 2014-02-25 ENCOUNTER — Ambulatory Visit: Payer: 59

## 2014-03-03 ENCOUNTER — Encounter: Payer: Self-pay | Admitting: Pharmacist Clinician (PhC)/ Clinical Pharmacy Specialist

## 2014-03-03 ENCOUNTER — Ambulatory Visit (INDEPENDENT_AMBULATORY_CARE_PROVIDER_SITE_OTHER): Payer: 59

## 2014-03-03 ENCOUNTER — Encounter: Payer: Self-pay | Admitting: Family Medicine

## 2014-03-03 ENCOUNTER — Ambulatory Visit (INDEPENDENT_AMBULATORY_CARE_PROVIDER_SITE_OTHER): Payer: 59 | Admitting: Pharmacist Clinician (PhC)/ Clinical Pharmacy Specialist

## 2014-03-03 ENCOUNTER — Ambulatory Visit (INDEPENDENT_AMBULATORY_CARE_PROVIDER_SITE_OTHER): Payer: 59 | Admitting: Family Medicine

## 2014-03-03 VITALS — BP 103/74 | HR 71 | Temp 98.7°F | Ht 74.0 in | Wt 204.0 lb

## 2014-03-03 DIAGNOSIS — E785 Hyperlipidemia, unspecified: Secondary | ICD-10-CM

## 2014-03-03 DIAGNOSIS — M25519 Pain in unspecified shoulder: Secondary | ICD-10-CM

## 2014-03-03 DIAGNOSIS — S40012A Contusion of left shoulder, initial encounter: Secondary | ICD-10-CM

## 2014-03-03 DIAGNOSIS — S40019A Contusion of unspecified shoulder, initial encounter: Secondary | ICD-10-CM

## 2014-03-03 MED ORDER — NAPROXEN-ESOMEPRAZOLE 375-20 MG PO TBEC: DELAYED_RELEASE_TABLET | ORAL | Status: DC

## 2014-03-03 NOTE — Progress Notes (Signed)
   Subjective:    Patient ID: Derrick Wheeler, male    DOB: 12-28-1970, 43 y.o.   MRN: [id number]  HPI Comments: Patient present today for hyperlipdemia consult and smoking cessation.  He has a twin brother who eats healthy and exercises and has normal cholesterol with no statin treatment.  Mother who is 6273 had diagnosed CAD last year and it is unknown if she is taking a statin.  Patient has no prior history of high cholesterol.  He has been following a very high fat diet recently due to be a single dad and working long hours.  He has multiple knee surgeries and injuries that prevent him from running and doing impact exercises.  He does lift weights.     Review of Systems  Constitutional: Negative.   Eyes: Negative.   Cardiovascular: Negative.   Endocrine: Negative.   Allergic/Immunologic: Negative.   Neurological: Negative.   Hematological: Negative.   Psychiatric/Behavioral: Negative.        Objective:   Physical Exam  Constitutional: He is oriented to person, place, and time. He appears well-developed and well-nourished.  Cardiovascular: Normal rate and regular rhythm.   Neurological: He is alert and oriented to person, place, and time.  Skin: Skin is warm and dry.  Psychiatric: He has a normal mood and affect. His behavior is normal. Judgment and thought content normal.          Assessment & Plan:   Lipid Clinic Consultation  Chief Complaint:  No chief complaint on file.    Exam Regularity:  RRR Edema:  neg Respirations:  18   Carotid Bruits:  neg Xanthomas:  neg General Appearance:  alert, oriented, no acute distress Mood/Affect:  normal  HPI:  New onset hyperlidiemia     Component Value Date/Time   CHOL 231* 02/19/2014 0826    Assessment: CHD/CHF Risk Equivalents:  none [place] Estd 8256yrs risk:  Not calculated NCEP Risk Factors Present:  smoker, family history and low HDL Primary Problem(s):  LDL or LDL-P elevated and HDL or HDL-P decreased  Current  NCEP Goals: LDL Goal < 130 HDL Goal >/= 40 Tg Goal < 119150 Non-HDL Goal < 160  Secondary cause of hyperlipidemia present:  smoking Low fat diet followed?  No -fast food  Low carb diet followed?  No -   Exercise?  No - no aerobic exercise due to knee injuries  Recommendations: Changes in lipid medication(s):  None started today with do diet and exercise for 8 weeks  Recheck Lipid Panel:  8 weeks Other labs needed:     Time spent counseling patient:  45 min  Physician time spent with patient:  0 Referring Provider:  Christell ConstantMoore   PharmD:  [facility] Pharmacist

## 2014-03-03 NOTE — Patient Instructions (Signed)
Use ice off and on on the shoulder for the next 48 hours then warm compresses Take meds as directed, after eating
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
## 2014-03-04 ENCOUNTER — Ambulatory Visit: Payer: 59 | Admitting: Family Medicine

## 2014-03-10 ENCOUNTER — Encounter: Payer: Self-pay | Admitting: Family Medicine

## 2014-03-10 ENCOUNTER — Ambulatory Visit (INDEPENDENT_AMBULATORY_CARE_PROVIDER_SITE_OTHER): Payer: 59 | Admitting: Family Medicine

## 2014-03-10 VITALS — BP 118/76 | HR 78 | Temp 98.3°F | Ht 74.0 in | Wt 207.0 lb

## 2014-03-10 DIAGNOSIS — M25519 Pain in unspecified shoulder: Secondary | ICD-10-CM

## 2014-03-10 DIAGNOSIS — M25512 Pain in left shoulder: Secondary | ICD-10-CM

## 2014-03-10 MED ORDER — NAPROXEN 375 MG PO TABS
375.0000 mg | ORAL_TABLET | Freq: Two times a day (BID) | ORAL | Status: DC
Start: 2014-03-10 — End: 2014-04-02

## 2014-03-10 NOTE — Progress Notes (Signed)
   Subjective:    Patient ID: Derrick Wheeler, male    DOB: Jul 27, 1971, 43 y.o.   MRN: [id number]  HPI Patient here today for 1 week follow up of left shoulder pain.      There are no active problems to display for this patient.  Outpatient Encounter Prescriptions as of 03/10/2014  Medication Sig  . Naproxen-Esomeprazole (VIMOVO) 375-20 MG TBEC Take 1 pill twice daily after breakfast and supper  . nicotine (NICODERM CQ - DOSED IN MG/24 HOURS) 14 mg/24hr patch Place 14 mg onto the skin daily.  Melene Muller. [START ON 04/07/2014] nicotine (NICODERM CQ - DOSED IN MG/24 HR) 7 mg/24hr patch Place 7 mg onto the skin daily.    Review of Systems  Constitutional: Negative.   HENT: Negative.   Eyes: Negative.   Respiratory: Negative.   Cardiovascular: Negative.   Gastrointestinal: Negative.   Endocrine: Negative.   Genitourinary: Negative.   Musculoskeletal: Positive for arthralgias (left shoulder pain).  Skin: Negative.   Allergic/Immunologic: Negative.   Neurological: Negative.   Hematological: Negative.   Psychiatric/Behavioral: Negative.        Objective:   Physical Exam  Nursing note and vitals reviewed. Constitutional: He is oriented to person, place, and time. He appears well-developed and well-nourished. No distress.  HENT:  Head: Normocephalic.  Mouth/Throat: Oropharynx is clear and moist.  Eyes: Conjunctivae and EOM are normal. Pupils are equal, round, and reactive to light. Right eye exhibits no discharge. Left eye exhibits no discharge. No scleral icterus.  Neck: Normal range of motion.  Pulmonary/Chest: Effort normal.  Abdominal: Soft. Bowel sounds are normal. He exhibits no mass.  Musculoskeletal: He exhibits tenderness (tender in the glenohumeral joint area and proximal to the neck.).  Patient still has limited range of motion of left upper extremity with increased pain with abduction of shoulder and posterior movement of shoulder  Neurological: He is alert and oriented to  person, place, and time. He has normal reflexes.  Skin: Skin is warm and dry. No rash noted.  Psychiatric: He has a normal mood and affect. His behavior is normal. Judgment and thought content normal.   BP 118/76  Pulse 78  Temp(Src) 98.3 F (36.8 C) (Oral)  Ht 6\' 2"  (1.88 m)  Wt 207 lb (93.895 kg)  BMI 26.57 kg/m2        Assessment & Plan:   1. Left shoulder pain -MRI planned Patient Instructions  Range of motion exercises Warm wet compresses to shoulder Take Naprosyn 375 enteric-coated one twice daily after breakfast and supper along with the Zantac 150 twice a day before breakfast and supper, We will arrange for you to get an MRI of the shoulder We will recheck you in about 2 weeks    Nyra Capeson W. Anum Palecek MD

## 2014-03-10 NOTE — Patient Instructions (Signed)
Range of motion exercises Warm wet compresses to shoulder Take Naprosyn 375 enteric-coated one twice daily after breakfast and supper along with the Zantac 150 twice a day before breakfast and supper, We will arrange for you to get an MRI of the shoulder We will recheck you in about 2 weeks

## 2014-03-16 ENCOUNTER — Ambulatory Visit (HOSPITAL_COMMUNITY)
Admission: RE | Admit: 2014-03-16 | Discharge: 2014-03-16 | Disposition: A | Discharge status: Home / Self Care | Payer: 59 | Source: Ambulatory Visit | Attending: Family Medicine | Admitting: Family Medicine

## 2014-03-16 DIAGNOSIS — M719 Bursopathy, unspecified: Principal | ICD-10-CM | POA: Insufficient documentation

## 2014-03-16 DIAGNOSIS — M19019 Primary osteoarthritis, unspecified shoulder: Secondary | ICD-10-CM | POA: Insufficient documentation

## 2014-03-16 DIAGNOSIS — M25512 Pain in left shoulder: Secondary | ICD-10-CM

## 2014-03-16 DIAGNOSIS — M25519 Pain in unspecified shoulder: Secondary | ICD-10-CM | POA: Insufficient documentation

## 2014-03-16 DIAGNOSIS — M67919 Unspecified disorder of synovium and tendon, unspecified shoulder: Secondary | ICD-10-CM | POA: Insufficient documentation

## 2014-03-24 ENCOUNTER — Other Ambulatory Visit: Payer: Self-pay | Admitting: *Deleted

## 2014-03-24 DIAGNOSIS — M25519 Pain in unspecified shoulder: Secondary | ICD-10-CM

## 2014-04-01 ENCOUNTER — Encounter: Payer: Self-pay | Admitting: Internal Medicine

## 2014-04-02 ENCOUNTER — Ambulatory Visit (INDEPENDENT_AMBULATORY_CARE_PROVIDER_SITE_OTHER): Payer: 59 | Admitting: Nurse Practitioner

## 2014-04-02 VITALS — BP 107/71 | HR 81 | Temp 97.5°F

## 2014-04-02 DIAGNOSIS — R42 Dizziness and giddiness: Secondary | ICD-10-CM

## 2014-04-02 LAB — POCT CBC
Granulocyte percent: 64.3 %G (ref 37–80)
HCT, POC: 48.3 % (ref 43.5–53.7)
Hemoglobin: 15.4 g/dL (ref 14.1–18.1)
Lymph, poc: 2.3 (ref 0.6–3.4)
MCH, POC: 28.1 pg (ref 27–31.2)
MCHC: 32 g/dL (ref 31.8–35.4)
MCV: 87.8 fL (ref 80–97)
MPV: 7.4 fL (ref 0–99.8)
POC GRANULOCYTE: 4.6 (ref 2–6.9)
POC LYMPH %: 32.2 % (ref 10–50)
Platelet Count, POC: 239 10*3/uL (ref 142–424)
RBC: 5.5 M/uL (ref 4.69–6.13)
RDW, POC: 13.8 %
WBC: 7.1 10*3/uL (ref 4.6–10.2)

## 2014-04-02 LAB — GLUCOSE, POCT (MANUAL RESULT ENTRY): POC GLUCOSE: 185 mg/dL — AB (ref 70–99)

## 2014-04-02 NOTE — Progress Notes (Signed)
   Subjective:    Patient ID: Derrick Wheeler, male    DOB: 1971/08/29, 43 y.o.   MRN: [id number]  HPI Patient presents today complaining of dizziness and chest pain that began about an hour ago. States he was playing around with his son when he became lightheaded and dizzy. Was given a glass of orange juice. Has had several meal today and states he feels better since arriving, just feels tired. Denies headache, dizziness, or chest pain currently.    Review of Systems  Respiratory: Negative for shortness of breath.   Cardiovascular: Negative for chest pain and palpitations.  Neurological: Negative for dizziness, light-headedness and headaches.  All other systems reviewed and are negative.      Objective:   Physical Exam  Constitutional: He is oriented to person, place, and time. He appears well-developed and well-nourished.  Cardiovascular: Normal rate, regular rhythm and normal heart sounds.   Pulmonary/Chest: Effort normal and breath sounds normal.  Neurological: He is alert and oriented to person, place, and time.  Skin: Skin is warm and dry.  Psychiatric: He has a normal mood and affect. His behavior is normal. Judgment and thought content normal.      BP 107/71  Pulse 81  Temp(Src) 97.5 F (36.4 C) (Oral)  Results for orders placed in visit on 04/02/14  GLUCOSE, POCT (MANUAL RESULT ENTRY)      Result Value Ref Range   POC Glucose 185 (*) 70 - 99 mg/dl   EKG: NSR Preliminary reading by Ronnald Collum, FNP  [facility]     Assessment & Plan:   1. Dizziness and giddiness    Orders Placed This Encounter  Procedures  . BMP8+EGFR  . POCT glucose (manual entry)  . POCT CBC  . EKG 12-Lead   Rest  Force fluids Will call with lab results RTC if symptoms do not improve or worsen  Mary-Margaret Hassell Done, FNP

## 2014-04-02 NOTE — Patient Instructions (Signed)

## 2014-04-03 ENCOUNTER — Ambulatory Visit: Payer: Self-pay | Admitting: Internal Medicine

## 2014-04-03 LAB — BMP8+EGFR
BUN/Creatinine Ratio: 9 (ref 9–20)
BUN: 10 mg/dL (ref 6–24)
CALCIUM: 9.7 mg/dL (ref 8.7–10.2)
CHLORIDE: 100 mmol/L (ref 97–108)
CO2: 23 mmol/L (ref 18–29)
Creatinine, Ser: 1.14 mg/dL (ref 0.76–1.27)
GFR calc non Af Amer: 78 mL/min/{1.73_m2} (ref >59)
GFR, EST AFRICAN AMERICAN: 91 mL/min/{1.73_m2} (ref >59)
GLUCOSE: 182 mg/dL — AB (ref 65–99)
Potassium: 4.2 mmol/L (ref 3.5–5.2)
Sodium: 140 mmol/L (ref 134–144)

## 2014-04-06 ENCOUNTER — Telehealth: Payer: Self-pay | Admitting: *Deleted

## 2014-04-06 NOTE — Telephone Encounter (Signed)
Aware of results. 

## 2014-04-06 NOTE — Telephone Encounter (Signed)
Message copied by Almeta MonasSTONE, Yetunde Leis M on Mon Apr 06, 2014  9:34 AM ------      Message from: Bennie PieriniMARTIN, MARY-MARGARET      Created: Fri Apr 03, 2014  1:41 PM       All labs are normal      Blood sugar was non fasting so is normal at 185 ------

## 2014-05-25 ENCOUNTER — Ambulatory Visit: Payer: 59 | Admitting: Internal Medicine

## 2014-07-24 ENCOUNTER — Encounter: Payer: Self-pay | Admitting: *Deleted

## 2014-07-30 ENCOUNTER — Ambulatory Visit: Payer: 59 | Admitting: Internal Medicine

## 2014-09-16 ENCOUNTER — Ambulatory Visit (INDEPENDENT_AMBULATORY_CARE_PROVIDER_SITE_OTHER): Payer: 59 | Admitting: Family Medicine

## 2014-09-16 ENCOUNTER — Telehealth: Payer: Self-pay | Admitting: Family Medicine

## 2014-09-16 ENCOUNTER — Encounter: Payer: Self-pay | Admitting: Family Medicine

## 2014-09-16 VITALS — BP 102/70 | HR 79 | Temp 98.4°F | Wt 205.0 lb

## 2014-09-16 DIAGNOSIS — K2100 Gastro-esophageal reflux disease with esophagitis, without bleeding: Secondary | ICD-10-CM

## 2014-09-16 DIAGNOSIS — R072 Precordial pain: Secondary | ICD-10-CM

## 2014-09-16 DIAGNOSIS — R739 Hyperglycemia, unspecified: Secondary | ICD-10-CM

## 2014-09-16 DIAGNOSIS — R5383 Other fatigue: Secondary | ICD-10-CM

## 2014-09-16 DIAGNOSIS — R5381 Other malaise: Secondary | ICD-10-CM

## 2014-09-16 DIAGNOSIS — R7309 Other abnormal glucose: Secondary | ICD-10-CM

## 2014-09-16 DIAGNOSIS — E162 Hypoglycemia, unspecified: Secondary | ICD-10-CM

## 2014-09-16 LAB — POCT URINALYSIS DIPSTICK
Bilirubin, UA: NEGATIVE
Blood, UA: NEGATIVE
Glucose, UA: NEGATIVE
Ketones, UA: NEGATIVE
Leukocytes, UA: NEGATIVE
Nitrite, UA: NEGATIVE
Protein, UA: NEGATIVE
Spec Grav, UA: 1.015
Urobilinogen, UA: NEGATIVE
pH, UA: 6

## 2014-09-16 LAB — POCT UA - MICROSCOPIC ONLY
Bacteria, U Microscopic: NEGATIVE
Casts, Ur, LPF, POC: NEGATIVE
Crystals, Ur, HPF, POC: NEGATIVE
Epithelial cells, urine per micros: NEGATIVE
Mucus, UA: NEGATIVE
RBC, urine, microscopic: NEGATIVE
WBC, Ur, HPF, POC: NEGATIVE
Yeast, UA: NEGATIVE

## 2014-09-16 LAB — POCT CBC
Granulocyte percent: 65.4 %G (ref 37–80)
HCT, POC: 40 % — AB (ref 43.5–53.7)
Hemoglobin: 15.9 g/dL (ref 14.1–18.1)
Lymph, poc: 2.5 (ref 0.6–3.4)
MCH, POC: 34.7 pg — AB (ref 27–31.2)
MCHC: 39.9 g/dL — AB (ref 31.8–35.4)
MCV: 87 fL (ref 80–97)
MPV: 7.4 fL (ref 0–99.8)
POC Granulocyte: 5.1 (ref 2–6.9)
POC LYMPH PERCENT: 31.5 %L (ref 10–50)
Platelet Count, POC: 216 10*3/uL (ref 142–424)
RBC: 4.6 M/uL — AB (ref 4.69–6.13)
RDW, POC: 12.8 %
WBC: 7.8 10*3/uL (ref 4.6–10.2)

## 2014-09-16 LAB — GLUCOSE, POCT (MANUAL RESULT ENTRY): POC Glucose: 98 mg/dl (ref 70–99)

## 2014-09-16 LAB — POCT GLYCOSYLATED HEMOGLOBIN (HGB A1C): Hemoglobin A1C: 5.2

## 2014-09-16 MED ORDER — OMEPRAZOLE 20 MG PO CPDR: 20.0000 mg | DELAYED_RELEASE_CAPSULE | Freq: Every day | ORAL | Status: DC

## 2014-09-16 NOTE — Progress Notes (Signed)
   Subjective:    Patient ID: Derrick Wheeler, male    DOB: 01-27-1971, 43 y.o.   MRN: [id number]  HPI C/o fatigue, chest tightness, GERD, low blood sugar.  He is c/o substernal chest tightness.  He had episode of syncope a week ago and is seeing cardiology.  He had the syncopal episode when having diarrhea and GI sx's.  He passed out after having frequent BM's.  He is having hypoglycemia episodes. Review of chart shows hx of elevated blood sugars.     Review of Systems C/o chest pain, hypoglycemia, syncope, fatigue No  SOB, HA, dizziness, vision change, N/V, diarrhea, constipation, dysuria, urinary urgency or frequency, myalgias, arthralgias or rash.     Objective:   Physical Exam Vital signs noted  Well developed well nourished male.  HEENT - Head atraumatic Normocephalic                Eyes - PERRLA, Conjuctiva - clear Sclera- Clear EOMI                Ears - EAC's Wnl TM's Wnl Gross Hearing WNL                Throat - oropharanx wnl Respiratory - Lungs CTA bilateral Cardiac - RRR S1 and S2 without murmur GI - Abdomen soft Nontender and bowel sounds active x 4 Extremities - No edema. Neuro - Grossly intact.  EKG - NSR without acute ST-T changes.     Assessment & Plan:  Hypoglycemia - Plan: POCT glucose (manual entry), POCT glycosylated hemoglobin (Hb A1C)  Gastroesophageal reflux disease with esophagitis - Plan: omeprazole (PRILOSEC) 20 MG capsule  Precordial pain - Plan: POCT glycosylated hemoglobin (Hb A1C), EKG 12-Lead  Other malaise and fatigue - Plan: POCT glycosylated hemoglobin (Hb A1C), POCT urinalysis dipstick, POCT UA - Microscopic Only, TSH, Vitamin B12, POCT CBC, CMP14+EGFR  Hyperglycemia - Plan: POCT glycosylated hemoglobin (Hb A1C)  Lysbeth Penner FNP

## 2014-09-16 NOTE — Telephone Encounter (Signed)
Coming in now

## 2014-09-17 LAB — CMP14+EGFR
ALT: 14 IU/L (ref 0–44)
AST: 14 IU/L (ref 0–40)
Albumin/Globulin Ratio: 1.7 (ref 1.1–2.5)
Albumin: 4.5 g/dL (ref 3.5–5.5)
Alkaline Phosphatase: 66 IU/L (ref 39–117)
BUN/Creatinine Ratio: 11 (ref 9–20)
BUN: 15 mg/dL (ref 6–24)
CO2: 27 mmol/L (ref 18–29)
Calcium: 9.7 mg/dL (ref 8.7–10.2)
Chloride: 98 mmol/L (ref 97–108)
Creatinine, Ser: 1.37 mg/dL — ABNORMAL HIGH (ref 0.76–1.27)
GFR calc Af Amer: 72 mL/min/{1.73_m2} (ref >59)
GFR calc non Af Amer: 62 mL/min/{1.73_m2} (ref >59)
Globulin, Total: 2.6 g/dL (ref 1.5–4.5)
Glucose: 81 mg/dL (ref 65–99)
Potassium: 5 mmol/L (ref 3.5–5.2)
Sodium: 139 mmol/L (ref 134–144)
Total Bilirubin: 0.3 mg/dL (ref 0.0–1.2)
Total Protein: 7.1 g/dL (ref 6.0–8.5)

## 2014-09-17 LAB — TSH: TSH: 1.01 u[IU]/mL (ref 0.450–4.500)

## 2014-09-17 LAB — VITAMIN B12: Vitamin B-12: 358 pg/mL (ref 211–946)

## 2014-09-22 ENCOUNTER — Encounter: Payer: Self-pay | Admitting: Pharmacist Clinician (PhC)/ Clinical Pharmacy Specialist

## 2014-09-22 ENCOUNTER — Ambulatory Visit (INDEPENDENT_AMBULATORY_CARE_PROVIDER_SITE_OTHER): Payer: 59 | Admitting: Pharmacist Clinician (PhC)/ Clinical Pharmacy Specialist

## 2014-09-22 DIAGNOSIS — E860 Dehydration: Secondary | ICD-10-CM

## 2014-09-22 DIAGNOSIS — E785 Hyperlipidemia, unspecified: Secondary | ICD-10-CM

## 2014-09-22 NOTE — Progress Notes (Signed)
Discussed eating options to help avoid hypoglycemia episodes and reviewed patients current dietary habits and made recommendations.  Patient has not had lipids re-checked since my appointment with him 02/2014.  He will come in fasting next week to have his lipids re-checked and serum creatinine checked since is was elevated on 9/30.  Patient is alert, pleasant, and well groomed in no apparent distress.  His blood pressure is normal and no tachydardia or irregular heart beats are heard.

## 2015-02-04 ENCOUNTER — Ambulatory Visit: Payer: Self-pay | Admitting: Family Medicine

## 2015-02-04 ENCOUNTER — Other Ambulatory Visit (INDEPENDENT_AMBULATORY_CARE_PROVIDER_SITE_OTHER): Payer: 59

## 2015-02-04 DIAGNOSIS — K2100 Gastro-esophageal reflux disease with esophagitis, without bleeding: Secondary | ICD-10-CM

## 2015-02-04 DIAGNOSIS — Z0000 Encounter for general adult medical examination without abnormal findings: Secondary | ICD-10-CM

## 2015-02-04 DIAGNOSIS — E785 Hyperlipidemia, unspecified: Secondary | ICD-10-CM

## 2015-02-04 DIAGNOSIS — R079 Chest pain, unspecified: Secondary | ICD-10-CM

## 2015-02-04 NOTE — Progress Notes (Signed)
LAB ONLY 

## 2015-02-05 LAB — CBC WITH DIFFERENTIAL/PLATELET
BASOS ABS: 0 10*3/uL (ref 0.0–0.2)
Basos: 1 %
Eos: 5 %
Eosinophils Absolute: 0.3 10*3/uL (ref 0.0–0.4)
HCT: 46.1 % (ref 37.5–51.0)
Hemoglobin: 16 g/dL (ref 12.6–17.7)
Immature Grans (Abs): 0 10*3/uL (ref 0.0–0.1)
Immature Granulocytes: 0 %
LYMPHS ABS: 2.3 10*3/uL (ref 0.7–3.1)
LYMPHS: 38 %
MCH: 29.4 pg (ref 26.6–33.0)
MCHC: 34.7 g/dL (ref 31.5–35.7)
MCV: 85 fL (ref 79–97)
Monocytes Absolute: 0.6 10*3/uL (ref 0.1–0.9)
Monocytes: 9 %
Neutrophils Absolute: 2.8 10*3/uL (ref 1.4–7.0)
Neutrophils Relative %: 47 %
Platelets: 255 10*3/uL (ref 150–379)
RBC: 5.44 x10E6/uL (ref 4.14–5.80)
RDW: 13.6 % (ref 12.3–15.4)
WBC: 6 10*3/uL (ref 3.4–10.8)

## 2015-02-05 LAB — NMR, LIPOPROFILE
CHOLESTEROL: 245 mg/dL — AB (ref 100–199)
HDL Cholesterol by NMR: 46 mg/dL (ref >39)
HDL Particle Number: 28.5 umol/L — ABNORMAL LOW (ref >=30.5)
LDL PARTICLE NUMBER: 2521 nmol/L — AB (ref <1000)
LDL Size: 20.8 nm (ref >20.5)
LDL-C: 174 mg/dL — AB (ref 0–99)
LP-IR Score: 80 — ABNORMAL HIGH (ref <=45)
SMALL LDL PARTICLE NUMBER: 1323 nmol/L — AB (ref <=527)
TRIGLYCERIDES BY NMR: 126 mg/dL (ref 0–149)

## 2015-02-05 LAB — BMP8+EGFR
BUN/Creatinine Ratio: 12 (ref 9–20)
BUN: 13 mg/dL (ref 6–24)
CO2: 21 mmol/L (ref 18–29)
CREATININE: 1.05 mg/dL (ref 0.76–1.27)
Calcium: 9.5 mg/dL (ref 8.7–10.2)
Chloride: 102 mmol/L (ref 97–108)
GFR calc Af Amer: 99 mL/min/{1.73_m2} (ref >59)
GFR, EST NON AFRICAN AMERICAN: 86 mL/min/{1.73_m2} (ref >59)
GLUCOSE: 98 mg/dL (ref 65–99)
Potassium: 4.2 mmol/L (ref 3.5–5.2)
Sodium: 141 mmol/L (ref 134–144)

## 2015-02-05 LAB — HEPATIC FUNCTION PANEL
ALT: 21 IU/L (ref 0–44)
AST: 19 IU/L (ref 0–40)
Albumin: 4.5 g/dL (ref 3.5–5.5)
Alkaline Phosphatase: 66 IU/L (ref 39–117)
BILIRUBIN, DIRECT: 0.1 mg/dL (ref 0.00–0.40)
Bilirubin Total: 0.4 mg/dL (ref 0.0–1.2)
Total Protein: 7.2 g/dL (ref 6.0–8.5)

## 2015-02-05 LAB — VITAMIN D 25 HYDROXY (VIT D DEFICIENCY, FRACTURES): Vit D, 25-Hydroxy: 22.9 ng/mL — ABNORMAL LOW (ref 30.0–100.0)

## 2015-02-09 ENCOUNTER — Other Ambulatory Visit: Payer: Self-pay | Admitting: *Deleted

## 2015-02-09 MED ORDER — ROSUVASTATIN CALCIUM 10 MG PO TABS
10.0000 mg | ORAL_TABLET | Freq: Every day | ORAL | Status: DC
Start: 2015-02-09 — End: 2016-02-14

## 2015-02-11 ENCOUNTER — Ambulatory Visit: Payer: 59

## 2015-02-15 ENCOUNTER — Ambulatory Visit (INDEPENDENT_AMBULATORY_CARE_PROVIDER_SITE_OTHER): Payer: 59

## 2015-02-15 ENCOUNTER — Encounter: Payer: Self-pay | Admitting: Family Medicine

## 2015-02-15 ENCOUNTER — Ambulatory Visit (INDEPENDENT_AMBULATORY_CARE_PROVIDER_SITE_OTHER): Payer: 59 | Admitting: Family Medicine

## 2015-02-15 ENCOUNTER — Encounter: Payer: Self-pay | Admitting: *Deleted

## 2015-02-15 VITALS — BP 101/71 | HR 83 | Temp 97.1°F | Ht 74.0 in | Wt 212.0 lb

## 2015-02-15 DIAGNOSIS — R1013 Epigastric pain: Secondary | ICD-10-CM

## 2015-02-15 DIAGNOSIS — Z8489 Family history of other specified conditions: Secondary | ICD-10-CM | POA: Diagnosis not present

## 2015-02-15 DIAGNOSIS — R0789 Other chest pain: Secondary | ICD-10-CM

## 2015-02-15 DIAGNOSIS — E785 Hyperlipidemia, unspecified: Secondary | ICD-10-CM | POA: Diagnosis not present

## 2015-02-15 DIAGNOSIS — R0602 Shortness of breath: Secondary | ICD-10-CM

## 2015-02-15 DIAGNOSIS — Z8379 Family history of other diseases of the digestive system: Secondary | ICD-10-CM

## 2015-02-15 MED ORDER — OMEPRAZOLE 40 MG PO CPDR: 40.0000 mg | DELAYED_RELEASE_CAPSULE | Freq: Two times a day (BID) | ORAL | Status: DC

## 2015-02-15 MED ORDER — MELOXICAM 15 MG PO TABS: 15.0000 mg | ORAL_TABLET | Freq: Every day | ORAL | Status: DC

## 2015-02-15 NOTE — Patient Instructions (Addendum)
The patient should avoid heavy lifting pushing or pulling until the chest wall pain is improved He should take the meloxicam 15 mg 1 daily after breakfast for 1 week and then reduce this to one half after breakfast. If he develops any rectal bleeding are increased GI discomfort he should discontinue this completely. He should increase his omeprazole to 40 mg twice daily one half hour before breakfast and supper. We will schedule him for an endoscopy because of the epigastric pain. He should continue to work with his diet and his cholesterol issues. He should hold the Crestor until we get the chest pain improved. He should continue with his diet. He should continue not to smoke. Use a cool mist humidifier at home and take Mucinex maximum strength, blue and white in color one twice daily for cough and congestion with a large glass of water

## 2015-02-15 NOTE — Progress Notes (Signed)
Subjective:    Patient ID: Derrick Wheeler, male    DOB: [birth year], 44 y.o.   MRN: [id number]  HPI Pt here for follow up on chest pains and SOB. He has recently seen cardiology and has had extensive work ups. The patient did have an extensive workup for this chest pain which included an echo cardiogram and EKG and a catheterization. He was told by the cardiologist that his coronary arteries were clear. He says he has this continued chest pain in the front and from the back almost continuously and it is worse with movement. History wise, he did give a history of moving some very heavy equipment on rollers prior to the onset of all this chest discomfort. He has also been told by one of the physicians that he needed to have an endoscopy because of continued epigastric pain. He still complains of shortness of breath and dizziness. He says his chest hurts substernally and around both sides.        There are no active problems to display for this patient.  Outpatient Encounter Prescriptions as of 02/15/2015  Medication Sig  . Multiple Vitamin (MULTIVITAMIN WITH MINERALS) TABS tablet Take by mouth.  Marland Kitchen omeprazole (PRILOSEC) 20 MG capsule Take 1 capsule (20 mg total) by mouth daily.  . rosuvastatin (CRESTOR) 10 MG tablet Take 1 tablet (10 mg total) by mouth daily. (Patient not taking: Reported on 02/15/2015)    Review of Systems  Constitutional: Negative.   HENT: Negative.   Eyes: Negative.   Respiratory: Positive for chest tightness and shortness of breath.   Cardiovascular: Positive for chest pain.  Gastrointestinal: Negative.   Endocrine: Negative.   Genitourinary: Negative.   Musculoskeletal: Positive for arthralgias (chest area pains and rib pain).  Skin: Negative.   Allergic/Immunologic: Negative.   Neurological: Negative.   Hematological: Negative.   Psychiatric/Behavioral: Negative.        Objective:   Physical Exam  Constitutional: He is oriented to person, place, and time.  He appears well-developed and well-nourished. He appears distressed.  HENT:  Head: Normocephalic and atraumatic.  Right Ear: External ear normal.  Left Ear: External ear normal.  Mouth/Throat: Oropharynx is clear and moist. No oropharyngeal exudate.  Nasal congestion  Eyes: Conjunctivae and EOM are normal. Pupils are equal, round, and reactive to light. Right eye exhibits no discharge. Left eye exhibits no discharge. No scleral icterus.  Neck: Normal range of motion. Neck supple. No thyromegaly present.  Cardiovascular: Normal rate, regular rhythm and normal heart sounds.   No murmur heard. The rhythm is regular at 72/m  Pulmonary/Chest: Effort normal and breath sounds normal. No respiratory distress. He has no wheezes. He has no rales. He exhibits no tenderness.  The chest is clear anteriorly and posteriorly and there is no pleural rubs.  Abdominal: Soft. Bowel sounds are normal. He exhibits no mass. There is tenderness. There is no rebound and no guarding.  There is epigastric tenderness.  Musculoskeletal: Normal range of motion. He exhibits no edema or tenderness.  The patient has sore his chest wall size and sternal area.  Lymphadenopathy:    He has no cervical adenopathy.  Neurological: He is alert and oriented to person, place, and time.  Skin: Skin is warm and dry. No rash noted. No erythema. No pallor.  Psychiatric: He has a normal mood and affect. His behavior is normal. Judgment and thought content normal.  Increased anxiety  Nursing note and vitals reviewed.  BP 101/71 mmHg  Pulse 83  Temp(Src) 97.1 F (36.2 C) (Oral)  Ht 6\' 2"  (1.88 m)  Wt 212 lb (96.163 kg)  BMI 27.21 kg/m2  SpO2 98%  [facility] reading (PRIMARY) by  Dr. Tracie HarrierMoore-chest x-ray, no active disease                                      Assessment & Plan:  1. Shortness of breath -Take Mucinex twice daily with a large glass of water - DG Chest 2 View; Future - Ambulatory referral to Gastroenterology  2.  Chest tightness -Avoid heavy lifting pushing or pulling and use warm wet compresses to chest wall and take anti-inflammatory medicine as directed - Ambulatory referral to Gastroenterology  3. Family history of hiatal hernia -Increase omeprazole to 40 twice daily while taking the meloxicam - Ambulatory referral to Gastroenterology  4. Abdominal pain, epigastric -Take omeprazole 40 mg twice daily one half hour before breakfast and supper  5. Chest wall pain -Take meloxicam as directed  6. Hyperlipidemia -Continue to work aggressively with diet and start Crestor when chest pain is diminishing  Meds ordered this encounter  Medications  . Multiple Vitamin (MULTIVITAMIN WITH MINERALS) TABS tablet    Sig: Take by mouth.  Marland Kitchen. omeprazole (PRILOSEC) 40 MG capsule    Sig: Take 1 capsule (40 mg total) by mouth 2 (two) times daily.    Dispense:  60 capsule    Refill:  3  . meloxicam (MOBIC) 15 MG tablet    Sig: Take 1 tablet (15 mg total) by mouth daily.    Dispense:  30 tablet    Refill:  1   Patient Instructions  The patient should avoid heavy lifting pushing or pulling until the chest wall pain is improved He should take the meloxicam 15 mg 1 daily after breakfast for 1 week and then reduce this to one half after breakfast. If he develops any rectal bleeding are increased GI discomfort he should discontinue this completely. He should increase his omeprazole to 40 mg twice daily one half hour before breakfast and supper. We will schedule him for an endoscopy because of the epigastric pain. He should continue to work with his diet and his cholesterol issues. He should hold the Crestor until we get the chest pain improved. He should continue with his diet. He should continue not to smoke. Use a cool mist humidifier at home and take Mucinex maximum strength, blue and white in color one twice daily for cough and congestion with a large glass of water   Nyra Capeson W. Varsha Knock MD

## 2015-02-16 ENCOUNTER — Emergency Department (HOSPITAL_COMMUNITY): Payer: 59

## 2015-02-16 ENCOUNTER — Telehealth: Payer: Self-pay | Admitting: Family Medicine

## 2015-02-16 ENCOUNTER — Other Ambulatory Visit: Payer: Self-pay | Admitting: Family Medicine

## 2015-02-16 ENCOUNTER — Encounter (HOSPITAL_COMMUNITY): Payer: Self-pay | Admitting: Emergency Medicine

## 2015-02-16 DIAGNOSIS — K219 Gastro-esophageal reflux disease without esophagitis: Secondary | ICD-10-CM | POA: Diagnosis not present

## 2015-02-16 DIAGNOSIS — M419 Scoliosis, unspecified: Secondary | ICD-10-CM | POA: Diagnosis not present

## 2015-02-16 DIAGNOSIS — R0602 Shortness of breath: Secondary | ICD-10-CM | POA: Insufficient documentation

## 2015-02-16 DIAGNOSIS — J449 Chronic obstructive pulmonary disease, unspecified: Secondary | ICD-10-CM

## 2015-02-16 DIAGNOSIS — Z87891 Personal history of nicotine dependence: Secondary | ICD-10-CM | POA: Diagnosis not present

## 2015-02-16 DIAGNOSIS — R079 Chest pain, unspecified: Secondary | ICD-10-CM | POA: Diagnosis not present

## 2015-02-16 LAB — CBC WITH DIFFERENTIAL/PLATELET
BASOS ABS: 0 10*3/uL (ref 0.0–0.1)
BASOS PCT: 0 % (ref 0–1)
EOS ABS: 0.2 10*3/uL (ref 0.0–0.7)
Eosinophils Relative: 3 % (ref 0–5)
HCT: 46.2 % (ref 39.0–52.0)
HEMOGLOBIN: 16.2 g/dL (ref 13.0–17.0)
Lymphocytes Relative: 36 % (ref 12–46)
Lymphs Abs: 3.1 10*3/uL (ref 0.7–4.0)
MCH: 29.5 pg (ref 26.0–34.0)
MCHC: 35.1 g/dL (ref 30.0–36.0)
MCV: 84.2 fL (ref 78.0–100.0)
MONOS PCT: 10 % (ref 3–12)
Monocytes Absolute: 0.8 10*3/uL (ref 0.1–1.0)
NEUTROS PCT: 51 % (ref 43–77)
Neutro Abs: 4.3 10*3/uL (ref 1.7–7.7)
Platelets: 254 10*3/uL (ref 150–400)
RBC: 5.49 MIL/uL (ref 4.22–5.81)
RDW: 12.4 % (ref 11.5–15.5)
WBC: 8.5 10*3/uL (ref 4.0–10.5)

## 2015-02-16 LAB — I-STAT CHEM 8, ED
BUN: 14 mg/dL (ref 6–23)
CALCIUM ION: 1.17 mmol/L (ref 1.12–1.23)
CHLORIDE: 102 mmol/L (ref 96–112)
Creatinine, Ser: 1.1 mg/dL (ref 0.50–1.35)
GLUCOSE: 102 mg/dL — AB (ref 70–99)
HCT: 51 % (ref 39.0–52.0)
Hemoglobin: 17.3 g/dL — ABNORMAL HIGH (ref 13.0–17.0)
Potassium: 3.9 mmol/L (ref 3.5–5.1)
Sodium: 140 mmol/L (ref 135–145)
TCO2: 22 mmol/L (ref 0–100)

## 2015-02-16 LAB — I-STAT TROPONIN, ED: Troponin i, poc: 0 ng/mL (ref 0.00–0.08)

## 2015-02-16 NOTE — Telephone Encounter (Signed)
Pt aware of cxr results and orders

## 2015-02-16 NOTE — ED Notes (Signed)
Pt st's he has had mid chest pain x's 3 months.  Pt st's he had a clean cardiac cath on 2/10.  Pt also c/o  Shortness of breath

## 2015-02-17 ENCOUNTER — Emergency Department (HOSPITAL_COMMUNITY)
Admission: EM | Admit: 2015-02-17 | Discharge: 2015-02-17 | Disposition: A | Discharge status: Home / Self Care | Payer: 59 | Attending: Emergency Medicine | Admitting: Emergency Medicine

## 2015-02-17 ENCOUNTER — Emergency Department (HOSPITAL_COMMUNITY): Payer: 59

## 2015-02-17 ENCOUNTER — Encounter (HOSPITAL_COMMUNITY): Payer: Self-pay

## 2015-02-17 DIAGNOSIS — R079 Chest pain, unspecified: Secondary | ICD-10-CM

## 2015-02-17 MED ORDER — DEXAMETHASONE SODIUM PHOSPHATE 10 MG/ML IJ SOLN
10.0000 mg | Freq: Once | INTRAMUSCULAR | Status: AC
  Administered 2015-02-17: 10 mg via INTRAVENOUS
  Filled 2015-02-17: qty 1

## 2015-02-17 MED ORDER — IOHEXOL 350 MG/ML SOLN
100.0000 mL | Freq: Once | INTRAVENOUS | Status: AC | PRN
  Administered 2015-02-17: 100 mL via INTRAVENOUS

## 2015-02-17 NOTE — ED Provider Notes (Signed)
CSN: [id number]     Arrival date & time 02/16/15  2103 History  This chart was scribed for Dione Booze, MD by Bronson Curb, ED Scribe. This patient was seen in room D30C/D30C and the patient's care was started at 12:37 AM.    Chief Complaint  Patient presents with  . Chest Pain    The history is provided by the patient. No language interpreter was used.     HPI Comments: Derrick Wheeler is a 44 y.o. male who presents to the Emergency Department complaining of constant, sharp, burning, achy, central chest pain that radiates across the right anterior chest to the back, for the past 3 weeks, but has gotten worse in the last 24 hours He currently rates his pain as 4-5/10, and a 5-6/10 at its worse. There is associated SOB and notes the chest pain is worse with movement. Patient was seen 2 days ago, by PCP, for the same and reports he was prescribed a new medication (meloxicam) for chest tightness. Patient suspects this to be the cause of his worsening chest pain. He states, after taking this medication, he began feeling "bad" with decreased appetite and rash, in addition to worsening chest pain. Patient states he has since stopped taking this medication after speaking with PCP. He reports having a cardiac catheterization and ECG approximately 3 weeks ago, January 27, 2014, with normal findings. He denies diaphoresis, nausea, or vomiting. Patient is a former ppd smoker for the past 8-9 years, but recently quit 1 month ago.  PCP: Dr. Christell Constant at [facility]  Past Medical History  Diagnosis Date  . Scoliosis   . GERD (gastroesophageal reflux disease)    Past Surgical History  Procedure Laterality Date  . Removal of testicular mass    . Knee surgery     Family History  Problem Relation Age of Onset  . Heart disease Mother   . Asthma Mother   . Arthritis Mother   . Heart disease Father   . Mental illness Sister    History  Substance Use Topics  . Smoking status:  Former Smoker -- 1.00 packs/day for 10 years    Types: Cigarettes    Quit date: 02/11/2013  . Smokeless tobacco: Never Used  . Alcohol Use: No    Review of Systems  Respiratory: Positive for shortness of breath.   Cardiovascular: Positive for chest pain.  All other systems reviewed and are negative.     Allergies  Meloxicam; Codeine; and Sulfa antibiotics  Home Medications   Prior to Admission medications   Medication Sig Start Date End Date Taking? Authorizing Provider  meloxicam (MOBIC) 15 MG tablet Take 1 tablet (15 mg total) by mouth daily. 02/15/15  Yes Ernestina Penna, MD  Multiple Vitamin (MULTIVITAMIN WITH MINERALS) TABS tablet Take 1 tablet by mouth daily.    Yes Historical Provider, MD  Omega-3 Fatty Acids (FISH OIL PO) Take 1 tablet by mouth every other day.   Yes Historical Provider, MD  omeprazole (PRILOSEC) 40 MG capsule Take 1 capsule (40 mg total) by mouth 2 (two) times daily. 02/15/15  Yes Ernestina Penna, MD  rosuvastatin (CRESTOR) 10 MG tablet Take 1 tablet (10 mg total) by mouth daily. Patient not taking: Reported on 02/15/2015 02/09/15   Ernestina Penna, MD   Triage Vitals: BP 111/78 mmHg  Pulse 82  Resp 16  Ht  (1.88 m)  Wt 215 lb (97.523 kg)  BMI 27.59 kg/m2  SpO2 98%  Physical Exam  Constitutional: He is oriented to person, place, and time. He appears well-developed and well-nourished. No distress.  HENT:  Head: Normocephalic and atraumatic.  Eyes: Conjunctivae and EOM are normal. Pupils are equal, round, and reactive to light.  Neck: Normal range of motion. Neck supple. No JVD present.  Cardiovascular: Normal rate, regular rhythm and normal heart sounds.   No murmur heard. Pulmonary/Chest: Effort normal and breath sounds normal. He has no wheezes. He has no rales. He exhibits no tenderness.  Abdominal: Bowel sounds are normal. He exhibits no distension and no mass. There is no tenderness.  Musculoskeletal: Normal range of motion. He exhibits no  edema.  Lymphadenopathy:    He has no cervical adenopathy.  Neurological: He is alert and oriented to person, place, and time. No cranial nerve deficit. Coordination normal.  Skin: Skin is warm and dry. No rash noted.  Psychiatric: He has a normal mood and affect. His behavior is normal. Judgment and thought content normal.  Nursing note and vitals reviewed.   ED Course  Procedures (including critical care time)  DIAGNOSTIC STUDIES: Oxygen Saturation is 98% on room air, normal by my interpretation.    COORDINATION OF CARE: At [id number] Discussed treatment plan with patient which includes imaging. Patient agrees.    Labs Review Labs Reviewed  I-STAT CHEM 8, ED - Abnormal; Notable for the following:    Glucose, Bld 102 (*)    Hemoglobin 17.3 (*)    All other components within normal limits  CBC WITH DIFFERENTIAL/PLATELET  Rosezena SensorI-STAT TROPOININ, ED    Imaging Review Dg Chest 2 View  02/16/2015   CLINICAL DATA:  Three-month history of chest pain  EXAM: CHEST  2 VIEW  COMPARISON:  February 15, 2015  FINDINGS: There is a degree of underlying emphysematous change, stable. The interstitium is overall prominent but stable, probably reflecting chronic inflammatory type change. There is no frank edema or consolidation. Heart size and pulmonary vascularity are normal. No adenopathy. No bone lesions.  IMPRESSION: Underlying emphysematous change.  No edema or consolidation.   Electronically Signed   By: Bretta BangWilliam  Woodruff III M.D.   On: 02/16/2015 22:03   Dg Chest 2 View  02/15/2015   CLINICAL DATA:  Three weeks of chest pain  EXAM: CHEST  2 VIEW  COMPARISON:  PA and lateral chest of January 16, 2015 and February 18, 2014.  FINDINGS: The lungs are mildly hyperinflated. The interstitial markings are coarse. The heart and pulmonary vascularity are normal. The mediastinum is normal in width. There is no pleural effusion or pneumothorax. The bony thorax is unremarkable.  IMPRESSION: COPD. Coarse increased lung  markings may reflect the patient's smoking history with superimposed acute bronchitic change. There is no pneumonia nor other acute cardiopulmonary abnormality. If the patient's symptoms persist and remain unexplained, chest CT scanning may be useful.   Electronically Signed   By: Tyjah Hai  [place]   On: 02/15/2015 10:10   Ct Angio Chest Pe W/cm &/or Wo Cm  02/17/2015   CLINICAL DATA:  Subacute onset of mid chest pain, radiating to the ribs, for 2-3 weeks. Shortness of breath. Initial encounter.  EXAM: CT ANGIOGRAPHY CHEST WITH CONTRAST  TECHNIQUE: Multidetector CT imaging of the chest was performed using the standard protocol during bolus administration of intravenous contrast. Multiplanar CT image reconstructions and MIPs were obtained to evaluate the vascular anatomy.  CONTRAST:  100mL OMNIPAQUE IOHEXOL 350 MG/ML SOLN  COMPARISON:  Chest radiograph performed 02/16/2015  FINDINGS: There is no evidence of  pulmonary embolus.  Minimal bibasilar atelectasis is noted. The lungs are otherwise clear. There is no evidence of significant focal consolidation, pleural effusion or pneumothorax. No masses are identified; no abnormal focal contrast enhancement is seen.  The mediastinum is unremarkable in appearance. No mediastinal lymphadenopathy is seen. No pericardial effusion is identified. The great vessels are grossly unremarkable in appearance. Residual thymic tissue is within normal limits. No axillary lymphadenopathy is seen. The visualized portions of the thyroid gland are unremarkable in appearance.  The visualized portions of the liver and spleen are unremarkable. The visualized portions of the pancreas, gallbladder, stomach, adrenal glands and kidneys are within normal limits.  No acute osseous abnormalities are seen.  Review of the MIP images confirms the above findings.  IMPRESSION: 1. No evidence of pulmonary embolus. 2. Minimal bibasilar atelectasis noted; lungs otherwise clear.   Electronically Signed   By:  Roanna Raider M.D.   On: 02/17/2015 02:49   Images viewed by me.   EKG Interpretation   Date/Time:  Tuesday February 16 2015 21:10:18 EST Ventricular Rate:  89 PR Interval:  160 QRS Duration: 96 QT Interval:  346 QTC Calculation: 420 R Axis:   73 Text Interpretation:  Normal sinus rhythm Normal ECG Confirmed by OTTER   MD, OLGA ([id number]) on 02/16/2015 11:57:57 PM      MDM   Final diagnoses:  Chest pain, unspecified chest pain type                  Chest pain of uncertain cause. Current symptoms seem to be made worse with meloxicam. His PCP had planned to get a CT scan to look for other causes of his pain. He will be sent for CT angiogram to also rule out pulmonary embolus.  CT angiogram is unremarkable. He will be given a dose of dexamethasone prior to discharge and is referred back to his PCP. He is advised to stop taking meloxicam. His problems may be gastrointestinal in origin and he may need referral to a gastroenterologist.  I personally performed the services described in this documentation, which was scribed in my presence. The recorded information has been reviewed and is accurate.      Dione Booze, MD 02/17/15 [phone]

## 2015-02-17 NOTE — ED Notes (Signed)
Patient transported to CT 

## 2015-02-17 NOTE — Discharge Instructions (Signed)
Your evaluation in the emergency department did not show a clear cause for your pain. Please follow-up with your primary care provider. He may need referral to a gastroenterologist for endoscopy. Stop taking meloxicam since this seems to make her symptoms worse.

## 2015-02-23 ENCOUNTER — Ambulatory Visit (INDEPENDENT_AMBULATORY_CARE_PROVIDER_SITE_OTHER): Payer: 59 | Admitting: Nurse Practitioner

## 2015-02-23 ENCOUNTER — Encounter: Payer: Self-pay | Admitting: Nurse Practitioner

## 2015-02-23 VITALS — BP 100/80 | HR 64 | Ht 74.0 in | Wt 212.2 lb

## 2015-02-23 DIAGNOSIS — K219 Gastro-esophageal reflux disease without esophagitis: Secondary | ICD-10-CM

## 2015-02-23 DIAGNOSIS — R0789 Other chest pain: Secondary | ICD-10-CM

## 2015-02-23 NOTE — Progress Notes (Signed)
HPI :  Patient is a 44 year old male, referred by PCP Dr. Rudi Heap for evaluation of chest pain. Patient has been experiencing chest discomfort for several months. He had a normal cardiac catheterization ([facility]) last month . Patient was started on meloxicam for possible musculoskeletal pain.  Chest pain became acutely worse after one day of meloxicam and patient went to ED on 02/17/15 where EKG was read as normal sinus rhythm. CT angiogram negative for pulmonary embolism. Troponin negative. WBC and hgb normal.   Patient also gives a several month history of GERD manifested as heartburn.  Symptoms have improved with recent dietary changes. In addition, PPI was doubled several days ago and that has further improved things but he still has chest discomfort at times. Patient endorses occasional solid food dysphagia. No weight loss. He recently stop smoking.   Patient gives a history of irritable bowel syndrome, mainly in the form of loose stools. He would sometimes have minor rectal bleeding with bowel movements. His bowel movements have completely normalized and no further bleeding since recent dietary modifications. Patient has no family history of colon cancer.   Past Medical History  Diagnosis Date  . Scoliosis   . GERD (gastroesophageal reflux disease)     Family History  Problem Relation Age of Onset  . Heart disease Mother   . Asthma Mother   . Arthritis Mother   . Heart disease Father   . Mental illness Sister    History  Substance Use Topics  . Smoking status: Former Smoker -- 1.00 packs/day for 10 years    Types: Cigarettes    Quit date: 02/11/2013  . Smokeless tobacco: Never Used  . Alcohol Use: No   Current Outpatient Prescriptions  Medication Sig Dispense Refill  . omeprazole (PRILOSEC) 40 MG capsule Take 1 capsule (40 mg total) by mouth 2 (two) times daily. 60 capsule 3  . Multiple Vitamin (MULTIVITAMIN WITH MINERALS) TABS tablet Take 1 tablet by mouth daily.     .  Omega-3 Fatty Acids (FISH OIL PO) Take 1 tablet by mouth every other day.    . rosuvastatin (CRESTOR) 10 MG tablet Take 1 tablet (10 mg total) by mouth daily. (Patient not taking: Reported on 02/15/2015) 90 tablet 3   No current facility-administered medications for this visit.   Allergies  Allergen Reactions  . Meloxicam Shortness Of Breath and Rash  . Codeine Rash  . Sulfa Antibiotics Rash     Review of Systems: Positive for back pain, vision changes, cough, muscle pain, shortness of breath. All other systems reviewed and negative except where noted in HPI.    Dg Chest 2 View  02/16/2015   CLINICAL DATA:  Three-month history of chest pain  EXAM: CHEST  2 VIEW  COMPARISON:  February 15, 2015  FINDINGS: There is a degree of underlying emphysematous change, stable. The interstitium is overall prominent but stable, probably reflecting chronic inflammatory type change. There is no frank edema or consolidation. Heart size and pulmonary vascularity are normal. No adenopathy. No bone lesions.  IMPRESSION: Underlying emphysematous change.  No edema or consolidation.   Electronically Signed   By: Bretta Bang III M.D.   On: 02/16/2015 22:03    Ct Angio Chest Pe W/cm &/or Wo Cm  02/17/2015   CLINICAL DATA:  Subacute onset of mid chest pain, radiating to the ribs, for 2-3 weeks. Shortness of breath. Initial encounter.  EXAM: CT ANGIOGRAPHY CHEST WITH CONTRAST  TECHNIQUE: Multidetector CT imaging of the chest  was performed using the standard protocol during bolus administration of intravenous contrast. Multiplanar CT image reconstructions and MIPs were obtained to evaluate the vascular anatomy.  CONTRAST:  100mL OMNIPAQUE IOHEXOL 350 MG/ML SOLN  COMPARISON:  Chest radiograph performed 02/16/2015  FINDINGS: There is no evidence of pulmonary embolus.  Minimal bibasilar atelectasis is noted. The lungs are otherwise clear. There is no evidence of significant focal consolidation, pleural effusion or  pneumothorax. No masses are identified; no abnormal focal contrast enhancement is seen.  The mediastinum is unremarkable in appearance. No mediastinal lymphadenopathy is seen. No pericardial effusion is identified. The great vessels are grossly unremarkable in appearance. Residual thymic tissue is within normal limits. No axillary lymphadenopathy is seen. The visualized portions of the thyroid gland are unremarkable in appearance.  The visualized portions of the liver and spleen are unremarkable. The visualized portions of the pancreas, gallbladder, stomach, adrenal glands and kidneys are within normal limits.  No acute osseous abnormalities are seen.  Review of the MIP images confirms the above findings.  IMPRESSION: 1. No evidence of pulmonary embolus. 2. Minimal bibasilar atelectasis noted; lungs otherwise clear.   Electronically Signed   By: Roanna RaiderJeffery  Chang M.D.   On: 02/17/2015 02:49    Physical Exam: BP 100/80 mmHg  Pulse 64  Ht 6\' 2"  (1.88 m)  Wt 212 lb 3.2 oz (96.253 kg)  BMI 27.23 kg/m2 Constitutional: Pleasant,well-developed, white male in no acute distress. HEENT: Normocephalic and atraumatic. Conjunctivae are normal. No scleral icterus. Neck supple.  Cardiovascular: Normal rate, regular rhythm.  Pulmonary/chest: Effort normal and breath sounds normal. No wheezing, rales or rhonchi. Abdominal: Soft, nondistended, nontender. Bowel sounds active throughout. There are no masses palpable. No hepatomegaly. Extremities: no edema Lymphadenopathy: No cervical adenopathy noted. Neurological: Alert and oriented to person place and time. Skin: Skin is warm and dry. No rashes noted. Psychiatric: Normal mood and affect. Behavior is normal.   ASSESSMENT AND PLAN:  591. 44 year old male with several month history of chest pain. Cardiac workup negative. CT angiogram negative for PE. LFTs normal. I do think patient has GERD but his chest pain seems musculoskeletal in origin though meloxicam worsened  the discomfort.  He is feeling a little better after increasing PPI.    Antireflux measures discussed, literature provided.   He has already made some positive lifestyle changes (diet modifications, discontinuation of smoking)  Advised patient to continue omeprazole 40 mg twice daily (30 minutes before breakfast and 30 minutes before dinner)  Patient has some anxiety surrounding his undiagnosed chest discomfort.  Reassurance provided.  He will be  scheduled for an upper endoscopy to be done in the next few weeks.  If symptoms continue to improve with above measures then we can hold off on EGD and follow clinically. Patient will let us know what he decides.   2. COPD by CXR. Patient recently discontinued smoking

## 2015-02-23 NOTE — Patient Instructions (Signed)
You have been scheduled for an endoscopy. Please follow written instructions given to you at your visit today. If you use inhalers (even only as needed), please bring them with you on the day of your procedure. Call us back 5 days prior to your procedure with an update, if your feeling better Gunnar Fusiaula may let you cancel.  Continue your omeprazole 40mg  one tablet twice daily 30 minutes before breakfast and supper.    Today you have been given GERD information to read and follow.    I appreciate the opportunity to care for you.

## 2015-02-24 DIAGNOSIS — R079 Chest pain, unspecified: Secondary | ICD-10-CM | POA: Insufficient documentation

## 2015-02-24 NOTE — Progress Notes (Signed)
Reviewed and agree with management plan.  Darriel Utter T. Clebert Wenger, MD FACG 

## 2015-03-08 ENCOUNTER — Encounter: Payer: Self-pay | Admitting: Family Medicine

## 2015-03-08 ENCOUNTER — Ambulatory Visit (INDEPENDENT_AMBULATORY_CARE_PROVIDER_SITE_OTHER): Payer: 59 | Admitting: Family Medicine

## 2015-03-08 VITALS — BP 103/73 | HR 93 | Temp 97.2°F | Ht 74.0 in | Wt 208.0 lb

## 2015-03-08 DIAGNOSIS — R072 Precordial pain: Secondary | ICD-10-CM

## 2015-03-08 DIAGNOSIS — K219 Gastro-esophageal reflux disease without esophagitis: Secondary | ICD-10-CM | POA: Diagnosis not present

## 2015-03-08 DIAGNOSIS — R0789 Other chest pain: Secondary | ICD-10-CM | POA: Diagnosis not present

## 2015-03-08 NOTE — Progress Notes (Signed)
Subjective:    Patient ID: Derrick Wheeler, male    DOB: 08/02/1971, 44 y.o.   MRN: [id number]  HPI Patient here today for 3 week follow up on chest pain, back pain and GI. It is important to note that prior to all of this chest pain and chest discomfort the patient single handedly try to push a very heavy piece of equipment with his arms and chest and subsequently he developed all of this pain. Since that time he is been on lighter duty work. He was tried on anti-inflammatory medicines and these bothered his stomach. He is scheduled for an endoscopy this Wednesday in 2 days from now. The patient is continuing to experience chest pain as indicated above.        Patient Active Problem List   Diagnosis Date Noted  . Chest pain 02/24/2015   Outpatient Encounter Prescriptions as of 03/08/2015  Medication Sig  . Multiple Vitamin (MULTIVITAMIN WITH MINERALS) TABS tablet Take 1 tablet by mouth daily.   . Omega-3 Fatty Acids (FISH OIL PO) Take 1 tablet by mouth every other day.  Marland Kitchen omeprazole (PRILOSEC) 40 MG capsule Take 1 capsule (40 mg total) by mouth 2 (two) times daily.  . rosuvastatin (CRESTOR) 10 MG tablet Take 1 tablet (10 mg total) by mouth daily.    Review of Systems  Constitutional: Negative.   HENT: Negative.   Eyes: Negative.   Respiratory: Positive for chest tightness.   Cardiovascular: Negative.   Gastrointestinal: Negative.   Endocrine: Negative.   Genitourinary: Negative.   Musculoskeletal: Positive for back pain.  Skin: Negative.   Allergic/Immunologic: Negative.   Neurological: Negative.   Hematological: Negative.   Psychiatric/Behavioral: Negative.        Objective:   Physical Exam  Constitutional: He is oriented to person, place, and time. He appears well-developed and well-nourished.  HENT:  Head: Normocephalic and atraumatic.  Eyes: Conjunctivae and EOM are normal. Pupils are equal, round, and reactive to light. Right eye exhibits no discharge. Left eye  exhibits no discharge. No scleral icterus.  Neck: Normal range of motion. Neck supple. No tracheal deviation present. No thyromegaly present.  Cardiovascular: Normal rate, regular rhythm and normal heart sounds.  Exam reveals no gallop and no friction rub.   No murmur heard. Pulmonary/Chest: Effort normal and breath sounds normal. No respiratory distress. He has no wheezes. He has no rales. He exhibits no tenderness.  The chest wall is nontender to palpation but the patient does have chest discomfort with arising from the exam table  Abdominal: Soft. Bowel sounds are normal. He exhibits no mass. There is tenderness. There is no rebound and no guarding.  There is slight epigastric tenderness  Musculoskeletal: Normal range of motion. He exhibits tenderness. He exhibits no edema.  Lymphadenopathy:    He has no cervical adenopathy.  Neurological: He is alert and oriented to person, place, and time.  Skin: Skin is warm and dry. No rash noted.  Psychiatric: He has a normal mood and affect. His behavior is normal. Judgment and thought content normal.  Nursing note and vitals reviewed.  BP 103/73 mmHg  Pulse 93  Temp(Src) 97.2 F (36.2 C) (Oral)  Ht  (1.88 m)  Wt 208 lb (94.348 kg)  BMI 26.69 kg/m2  Recent CT scan with contrast and chest x-ray were reviewed with the patient during the visit he was given copies of these reports.      Assessment & Plan:  1. Chest tightness -Patient's cardiac  workup has been negative. He had an [facility] [place].  2. Precordial pain -The patient has had chest x-ray and CT scan of the chest with contrast and all of these were negative other than some emphysematous changes. There is also some basilar atelectasis.  3. Gastroesophageal reflux disease, esophagitis presence not specified -The patient has continued to take omeprazole 40 twice daily before breakfast and supper and is scheduled for an endoscopy for further  follow-up in 2 days  No orders of the defined types were placed in this encounter.   Patient Instructions  The patient should use some warm wet compresses on his chest wall and on his back 20 minutes 3 or 4 times daily ideally  he should continue to avoid heavy lifting pushing pulling etc. Get endoscopy as planned Discuss with the gastroenterologist the possibility of taking a short course of prednisone by mouth We will wait until after the endoscopy before we prescribe any further medicine    Nyra Capeson W. Byrant Valent MD

## 2015-03-08 NOTE — Patient Instructions (Addendum)
The patient should use some warm wet compresses on his chest wall and on his back 20 minutes 3 or 4 times daily ideally  he should continue to avoid heavy lifting pushing pulling etc. Get endoscopy as planned Discuss with the gastroenterologist the possibility of taking a short course of prednisone by mouth We will wait until after the endoscopy before we prescribe any further medicine

## 2015-03-10 ENCOUNTER — Encounter: Payer: Self-pay | Admitting: Gastroenterology

## 2015-03-10 ENCOUNTER — Ambulatory Visit (AMBULATORY_SURGERY_CENTER): Payer: 59 | Admitting: Gastroenterology

## 2015-03-10 VITALS — BP 104/81 | HR 63 | Temp 97.2°F | Resp 16 | Ht 74.0 in | Wt 212.0 lb

## 2015-03-10 DIAGNOSIS — K219 Gastro-esophageal reflux disease without esophagitis: Secondary | ICD-10-CM

## 2015-03-10 DIAGNOSIS — R079 Chest pain, unspecified: Secondary | ICD-10-CM

## 2015-03-10 MED ORDER — SODIUM CHLORIDE 0.9 % IV SOLN: 500.0000 mL | INTRAVENOUS | Status: DC

## 2015-03-10 NOTE — Progress Notes (Signed)
Called to room to assist during endoscopic procedure.  Patient ID and intended procedure confirmed with present staff. Received instructions for my participation in the procedure from the performing physician.  

## 2015-03-10 NOTE — Op Note (Signed)
[facility] [address] [place], [id number]   ENDOSCOPY PROCEDURE REPORT  PATIENT: Derrick Wheeler, Derrick Wheeler  MR#: [id number] BIRTHDATE: 10/01/1971 , 43  yrs. old GENDER: male ENDOSCOPIST: Meryl DareMalcolm T Stark, MD, [facility] REFERRED BY:  Rudi Heaponald Moore, M.D. PROCEDURE DATE:  03/10/2015 PROCEDURE:  EGD w/ biopsy ASA CLASS:     Class II INDICATIONS:  chest pain and history of esophageal reflux. MEDICATIONS: Monitored anesthesia care and Propofol 200 mg IV TOPICAL ANESTHETIC: none DESCRIPTION OF PROCEDURE: After the risks benefits and alternatives of the procedure were thoroughly explained, informed consent was obtained.  The LB [id number] endoscope was introduced through the mouth and advanced to the second portion of the duodenum , Without limitations.  The instrument was slowly withdrawn as the mucosa was fully examined.    ESOPHAGUS: The z-line was located 41cm from the incisors.  The z line appeared irregular and biopsies were taken. DUODENUM: Duodenitis with bleeding was noted.   Duodenal inflammation was found in the duodenal bulb.  The duodenal mucosa showed no abnormalities in the 2nd part of the duodenum. STOMACH: The mucosa of the stomach appeared normal.  Retroflexed views revealed a small hiatal hernia.  The scope was then withdrawn from the patient and the procedure completed.  COMPLICATIONS: There were no immediate complications.  ENDOSCOPIC IMPRESSION: 1.   The z-line was variable 2.   Duodenitis in the bulb 3.   Small hiatal hernia  RECOMMENDATIONS: 1.  Anti-reflux regimen 2.  Await pathology results 3.  Continue PPI 4.  No GI cause for chest pain found  eSigned:  Meryl DareMalcolm T Stark, MD, [facility] 03/10/2015 3:41 PM

## 2015-03-10 NOTE — Patient Instructions (Signed)
  CONTINUE YOUR PPI, OMEPRAZOLE 20 TO 30 MINUTES PRIOR TO YOUR MORNING MEAL.  YOU HAD AN ENDOSCOPIC PROCEDURE TODAY AT THE [facility]:   Refer to the procedure report that was given to you for any specific questions about what was found during the examination.  If the procedure report does not answer your questions, please call your gastroenterologist to clarify.  If you requested that your care partner not be given the details of your procedure findings, then the procedure report has been included in a sealed envelope for you to review at your convenience later.  YOU SHOULD EXPECT: Some feelings of bloating in the abdomen. Passage of more gas than usual.  Walking can help get rid of the air that was put into your GI tract during the procedure and reduce the bloating. If you had a lower endoscopy (such as a colonoscopy or flexible sigmoidoscopy) you may notice spotting of blood in your stool or on the toilet paper. If you underwent a bowel prep for your procedure, you may not have a normal bowel movement for a few days.  Please Note:  You might notice some irritation and congestion in your nose or some drainage.  This is from the oxygen used during your procedure.  There is no need for concern and it should clear up in a day or so.  SYMPTOMS TO REPORT IMMEDIATELY:   Following upper endoscopy (EGD)  Vomiting of blood or coffee ground material  New chest pain or pain under the shoulder blades  Painful or persistently difficult swallowing  New shortness of breath  Fever of 100F or higher  Black, tarry-looking stools  For urgent or emergent issues, a gastroenterologist can be reached at any hour by calling [phone].   DIET: Your first meal following the procedure should be a small meal and then it is ok to progress to your normal diet. Heavy or fried foods are harder to digest and may make you feel nauseous or bloated.  Likewise, meals heavy in dairy and vegetables can increase  bloating.  Drink plenty of fluids but you should avoid alcoholic beverages for 24 hours.  ACTIVITY:  You should plan to take it easy for the rest of today and you should NOT DRIVE or use heavy machinery until tomorrow (because of the sedation medicines used during the test).    FOLLOW UP: Our staff will call the number listed on your records the next business day following your procedure to check on you and address any questions or concerns that you may have regarding the information given to you following your procedure. If we do not reach you, we will leave a message.  However, if you are feeling well and you are not experiencing any problems, there is no need to return our call.  We will assume that you have returned to your regular daily activities without incident.  If any biopsies were taken you will be contacted by phone or by letter within the next 1-3 weeks.  Please call us at [phone] if you have not heard about the biopsies in 3 weeks.    SIGNATURES/CONFIDENTIALITY: You and/or your care partner have signed paperwork which will be entered into your electronic medical record.  These signatures attest to the fact that that the information above on your After Visit Summary has been reviewed and is understood.  Full responsibility of the confidentiality of this discharge information lies with you and/or your care-partner.

## 2015-03-10 NOTE — Progress Notes (Signed)
Report to PACU, RN, vss, BBS= Clear.  

## 2015-03-11 ENCOUNTER — Telehealth: Payer: Self-pay | Admitting: *Deleted

## 2015-03-11 ENCOUNTER — Ambulatory Visit (INDEPENDENT_AMBULATORY_CARE_PROVIDER_SITE_OTHER): Payer: 59 | Admitting: *Deleted

## 2015-03-11 DIAGNOSIS — R079 Chest pain, unspecified: Secondary | ICD-10-CM

## 2015-03-11 MED ORDER — METHYLPREDNISOLONE ACETATE 80 MG/ML IJ SUSP
60.0000 mg | Freq: Once | INTRAMUSCULAR | Status: AC
  Administered 2015-03-11: 60 mg via INTRAMUSCULAR

## 2015-03-11 MED ORDER — PREDNISONE 10 MG PO TABS: ORAL_TABLET | ORAL | Status: DC

## 2015-03-11 NOTE — Telephone Encounter (Signed)
  Follow up Call-  Call back number 03/10/2015  Post procedure Call Back phone  # [phone] cell  Permission to leave phone message Yes     Patient questions:  Do you have a fever, pain , or abdominal swelling? No. Pain Score  0 *  Have you tolerated food without any problems? Yes.    Have you been able to return to your normal activities? Yes.    Do you have any questions about your discharge instructions: Diet   No. Medications  No. Follow up visit  No.  Do you have questions or concerns about your Care? No.  Actions: * If pain score is 4 or above: No action needed, pain <4.

## 2015-03-17 ENCOUNTER — Other Ambulatory Visit: Payer: 59

## 2015-03-18 ENCOUNTER — Encounter: Payer: Self-pay | Admitting: Gastroenterology

## 2015-03-26 ENCOUNTER — Telehealth: Payer: Self-pay | Admitting: Family Medicine

## 2015-03-26 NOTE — Telephone Encounter (Signed)
Stp and he pulled the tick off and it was completely intact. Advised pt he doesn't ntbs as of now, but to CB if he notices redness,irritation, tender to the touch, warmness at the tick bite site, fever, chills, extreme fatigue. Pt voiced understanding.

## 2015-03-29 ENCOUNTER — Telehealth: Payer: Self-pay | Admitting: Family Medicine

## 2015-03-29 NOTE — Telephone Encounter (Signed)
HAS APPT Wednesday   Now dizzy and having side pain -   Moved appt from wed to 8 am on tomorrow  Pt is aware go to ER tonight if pain gets worse

## 2015-03-30 ENCOUNTER — Ambulatory Visit (INDEPENDENT_AMBULATORY_CARE_PROVIDER_SITE_OTHER): Payer: 59 | Admitting: Family Medicine

## 2015-03-30 ENCOUNTER — Encounter: Payer: Self-pay | Admitting: Family Medicine

## 2015-03-30 VITALS — BP 104/72 | HR 74 | Temp 97.0°F | Ht 74.0 in | Wt 210.0 lb

## 2015-03-30 DIAGNOSIS — R109 Unspecified abdominal pain: Secondary | ICD-10-CM | POA: Diagnosis not present

## 2015-03-30 DIAGNOSIS — W57XXXA Bitten or stung by nonvenomous insect and other nonvenomous arthropods, initial encounter: Secondary | ICD-10-CM | POA: Diagnosis not present

## 2015-03-30 DIAGNOSIS — T148 Other injury of unspecified body region: Secondary | ICD-10-CM | POA: Diagnosis not present

## 2015-03-30 DIAGNOSIS — J301 Allergic rhinitis due to pollen: Secondary | ICD-10-CM | POA: Diagnosis not present

## 2015-03-30 DIAGNOSIS — R0789 Other chest pain: Secondary | ICD-10-CM

## 2015-03-30 DIAGNOSIS — H8303 Labyrinthitis, bilateral: Secondary | ICD-10-CM | POA: Diagnosis not present

## 2015-03-30 LAB — POCT UA - MICROSCOPIC ONLY
Bacteria, U Microscopic: NEGATIVE
CASTS, UR, LPF, POC: NEGATIVE
Crystals, Ur, HPF, POC: NEGATIVE
MUCUS UA: NEGATIVE
RBC, URINE, MICROSCOPIC: NEGATIVE
WBC, Ur, HPF, POC: NEGATIVE
YEAST UA: NEGATIVE

## 2015-03-30 LAB — POCT URINALYSIS DIPSTICK
Bilirubin, UA: NEGATIVE
Blood, UA: NEGATIVE
Glucose, UA: NEGATIVE
KETONES UA: NEGATIVE
LEUKOCYTES UA: NEGATIVE
Nitrite, UA: NEGATIVE
PROTEIN UA: NEGATIVE
Spec Grav, UA: >=1.03
UROBILINOGEN UA: NEGATIVE
pH, UA: 5

## 2015-03-30 LAB — POCT CBC
Granulocyte percent: 59.5 %G (ref 37–80)
HEMATOCRIT: 54.1 % — AB (ref 43.5–53.7)
HEMOGLOBIN: 16 g/dL (ref 14.1–18.1)
Lymph, poc: 2.4 (ref 0.6–3.4)
MCH, POC: 26 pg — AB (ref 27–31.2)
MCHC: 29.5 g/dL — AB (ref 31.8–35.4)
MCV: 88.1 fL (ref 80–97)
MPV: 7.3 fL (ref 0–99.8)
PLATELET COUNT, POC: 282 10*3/uL (ref 142–424)
POC Granulocyte: 4 (ref 2–6.9)
POC LYMPH PERCENT: 35.1 %L (ref 10–50)
RBC: 6.14 M/uL — AB (ref 4.69–6.13)
RDW, POC: 13.8 %
WBC: 6.7 10*3/uL (ref 4.6–10.2)

## 2015-03-30 MED ORDER — MECLIZINE HCL 12.5 MG PO TABS: 12.5000 mg | ORAL_TABLET | Freq: Three times a day (TID) | ORAL | Status: DC | PRN

## 2015-03-30 MED ORDER — FLUTICASONE PROPIONATE 50 MCG/ACT NA SUSP: 2.0000 | Freq: Every day | NASAL | Status: DC

## 2015-03-30 MED ORDER — DOXYCYCLINE HYCLATE 100 MG PO TABS: 100.0000 mg | ORAL_TABLET | Freq: Two times a day (BID) | ORAL | Status: DC

## 2015-03-30 NOTE — Progress Notes (Signed)
Subjective:    Patient ID: Derrick Wheeler, male    DOB: [birth year], 44 y.o.   MRN: [id number]  HPI Patient here today for follow up on chest tightness and he also states he found a tick on him last Thursday. He also started having dizzy spells and blurred vision on Sunday. The patient has had some ongoing problems with his chest and back for couple months. This stemmed from him lifting a very heavy object and chest pain and side pain followed from that time. He has had a thorough cardiac workup and has also had an endoscopy recently which showed a hiatal hernia and some redness and his stomach lining. Today he gives a history of a tick bite 5 days ago and he brought the tick in for review and it appears to be a wood tick. The tick was only on him for a very short period time and was removed from his left groin area. The short period time was less than 24 hours. 2-3 days later he developed dizziness with movement and felt better with being still and quiet. He does give a history of some increased sinus congestion. He's also having some epigastric and lower chest soreness. His back pain from several weeks ago is improving and he attributes this to a course of prednisone that was taken. He is still taking omeprazole 40 mg once daily for his hiatal hernia and gastritis. The dizziness that he is experiencing since 2 days ago is more with movement.        Patient Active Problem List   Diagnosis Date Noted  . Chest pain 02/24/2015   Outpatient Encounter Prescriptions as of 03/30/2015  Medication Sig  . Multiple Vitamin (MULTIVITAMIN WITH MINERALS) TABS tablet Take 1 tablet by mouth daily.   . Omega-3 Fatty Acids (FISH OIL PO) Take 1 tablet by mouth every other day.  Marland Kitchen omeprazole (PRILOSEC) 40 MG capsule Take 1 capsule (40 mg total) by mouth 2 (two) times daily.  . [DISCONTINUED] predniSONE (DELTASONE) 10 MG tablet TAPER- 1 tab by mouth four times daily for 2 days, 1 tab by mouth three times daily  for 2 days, 1 tab by mouth twice a day for 2 days, then 1 tab by mouth daily for 2 days, then stop.  . rosuvastatin (CRESTOR) 10 MG tablet Take 1 tablet (10 mg total) by mouth daily. (Patient not taking: Reported on 03/30/2015)    Review of Systems  Constitutional: Negative.   HENT: Negative.   Eyes: Negative.   Respiratory: Positive for chest tightness.   Cardiovascular: Negative.   Gastrointestinal: Negative.   Endocrine: Negative.   Genitourinary: Negative.   Musculoskeletal: Negative.   Skin: Negative.        Tick bite  Allergic/Immunologic: Negative.   Neurological: Positive for dizziness.  Hematological: Negative.   Psychiatric/Behavioral: Negative.        Objective:   Physical Exam  Constitutional: He is oriented to person, place, and time. He appears well-developed and well-nourished.  HENT:  Head: Normocephalic and atraumatic.  Right Ear: External ear normal.  Left Ear: External ear normal.  Mouth/Throat: Oropharynx is clear and moist. No oropharyngeal exudate.  Increased nasal congestion right greater than left  Eyes: Conjunctivae and EOM are normal. Pupils are equal, round, and reactive to light. Right eye exhibits no discharge. Left eye exhibits no discharge. No scleral icterus.  Neck: Normal range of motion. Neck supple. No thyromegaly present.  Cardiovascular: Normal rate, regular rhythm and normal heart sounds.  Exam reveals no gallop and no friction rub.   No murmur heard. The rhythm is regular at 72/m  Pulmonary/Chest: Effort normal and breath sounds normal. No respiratory distress. He has no wheezes. He has no rales. He exhibits no tenderness.  Abdominal: Soft. Bowel sounds are normal. He exhibits no mass. There is tenderness. There is no rebound and no guarding.  Minimal epigastric tenderness and upper abdominal tenderness. Normal bowel sounds. Slight suprapubic tenderness.  Musculoskeletal: Normal range of motion. He exhibits no edema.  Lymphadenopathy:     He has no cervical adenopathy.  Neurological: He is alert and oriented to person, place, and time.  Dizziness was aggravated with head movement looking up and down from side to side.  Skin: Skin is warm and dry. No rash noted. No erythema. No pallor.  There is a minimal spec at the left groin area with no redness or rash at the site of the tick bite.  Psychiatric: He has a normal mood and affect. His behavior is normal. Judgment and thought content normal.  Nursing note and vitals reviewed.  BP 104/72 mmHg  Pulse 74  Temp(Src) 97 F (36.1 C) (Oral)  Ht $R'6\' 2"'dd$  (1.88 m)  Wt 210 lb (95.255 kg)  BMI 26.95 kg/m2        Assessment & Plan:  1. Tick bite -Take one doxycycline twice daily for 1 day as a prophylactic dose of an antibiotic following a tick bite. - POCT CBC - Lyme Ab/Western Blot Reflex - Rocky mtn spotted fvr abs pnl(IgG+IgM) - BMP8+EGFR  2. Abdominal pain, unspecified abdominal location -Continue to take the omeprazole daily every morning a half an hour before breakfast for hiatal hernia and gastritis - POCT urinalysis dipstick - POCT UA - Microscopic Only - BMP8+EGFR  3. Chest tightness -Continue to avoid heavy lifting - BMP8+EGFR  4. Allergic rhinitis due to pollen -In addition to the Flonase use nasal saline and avoid allergen exposure as much as possible - fluticasone (FLONASE) 50 MCG/ACT nasal spray; Place 2 sprays into both nostrils daily.  Dispense: 16 g; Refill: 6  5. Labyrinthitis, bilateral -Drink plenty of fluids and avoid any climbing because of the dizziness and take the medicine regularly for 1 week - meclizine (ANTIVERT) 12.5 MG tablet; Take 1 tablet (12.5 mg total) by mouth 3 (three) times daily as needed for dizziness.  Dispense: 30 tablet; Refill: 1  Meds ordered this encounter  Medications  . doxycycline (VIBRA-TABS) 100 MG tablet    Sig: Take 1 tablet (100 mg total) by mouth 2 (two) times daily.    Dispense:  28 tablet    Refill:  0  .  meclizine (ANTIVERT) 12.5 MG tablet    Sig: Take 1 tablet (12.5 mg total) by mouth 3 (three) times daily as needed for dizziness.    Dispense:  30 tablet    Refill:  1  . fluticasone (FLONASE) 50 MCG/ACT nasal spray    Sig: Place 2 sprays into both nostrils daily.    Dispense:  16 g    Refill:  6   Patient Instructions  The patient should drink plenty of fluids He should take Tylenol for aches pains and fever He should avoid caffeine and continue to watch his diet for cholesterol He should avoid any heavy lifting He should take the medicine for dizziness regularly for 1 week and then as needed He should use nasal saline frequently 3 or 4 times daily for nasal congestion He should start Flonase 1-2 sprays  each nostril at bedtime for allergic rhinitis We will call him with the results of the lab work being done today as soon as that result comes available We will recheck chem and a couple weeks to make sure that all of these problems are improving   Arrie Senate MD

## 2015-03-30 NOTE — Patient Instructions (Signed)
The patient should drink plenty of fluids He should take Tylenol for aches pains and fever He should avoid caffeine and continue to watch his diet for cholesterol He should avoid any heavy lifting He should take the medicine for dizziness regularly for 1 week and then as needed He should use nasal saline frequently 3 or 4 times daily for nasal congestion He should start Flonase 1-2 sprays each nostril at bedtime for allergic rhinitis We will call him with the results of the lab work being done today as soon as that result comes available We will recheck chem and a couple weeks to make sure that all of these problems are improving

## 2015-03-31 ENCOUNTER — Ambulatory Visit: Payer: 59 | Admitting: Family Medicine

## 2015-04-01 ENCOUNTER — Telehealth: Payer: Self-pay | Admitting: Family Medicine

## 2015-04-01 LAB — BMP8+EGFR
BUN / CREAT RATIO: 11 (ref 9–20)
BUN: 12 mg/dL (ref 6–24)
CHLORIDE: 100 mmol/L (ref 97–108)
CO2: 26 mmol/L (ref 18–29)
Calcium: 9.8 mg/dL (ref 8.7–10.2)
Creatinine, Ser: 1.07 mg/dL (ref 0.76–1.27)
GFR, EST AFRICAN AMERICAN: 97 mL/min/{1.73_m2} (ref >59)
GFR, EST NON AFRICAN AMERICAN: 84 mL/min/{1.73_m2} (ref >59)
Glucose: 92 mg/dL (ref 65–99)
Potassium: 4.4 mmol/L (ref 3.5–5.2)
Sodium: 141 mmol/L (ref 134–144)

## 2015-04-01 LAB — ROCKY MTN SPOTTED FVR ABS PNL(IGG+IGM)
RMSF IgG: NEGATIVE
RMSF IgM: 0.48 index (ref 0.00–0.89)

## 2015-04-01 LAB — LYME AB/WESTERN BLOT REFLEX
LYME DISEASE AB, QUANT, IGM: <0.8 index (ref 0.00–0.79)
Lyme IgG/IgM Ab: <0.91 {ISR} (ref 0.00–0.90)

## 2015-04-02 ENCOUNTER — Ambulatory Visit: Payer: 59 | Admitting: Family Medicine

## 2015-04-02 NOTE — Telephone Encounter (Signed)
Returned call but patient unavailable.

## 2015-04-13 ENCOUNTER — Ambulatory Visit: Payer: 59 | Admitting: Family Medicine

## 2015-04-16 ENCOUNTER — Encounter: Payer: Self-pay | Admitting: Family Medicine

## 2015-04-16 ENCOUNTER — Ambulatory Visit (INDEPENDENT_AMBULATORY_CARE_PROVIDER_SITE_OTHER): Payer: 59 | Admitting: Family Medicine

## 2015-04-16 VITALS — BP 100/68 | HR 70 | Temp 98.9°F | Ht 74.0 in | Wt 209.0 lb

## 2015-04-16 DIAGNOSIS — R5383 Other fatigue: Secondary | ICD-10-CM

## 2015-04-16 DIAGNOSIS — R1013 Epigastric pain: Secondary | ICD-10-CM

## 2015-04-16 DIAGNOSIS — R5381 Other malaise: Secondary | ICD-10-CM

## 2015-04-16 DIAGNOSIS — R42 Dizziness and giddiness: Secondary | ICD-10-CM | POA: Diagnosis not present

## 2015-04-16 DIAGNOSIS — H538 Other visual disturbances: Secondary | ICD-10-CM

## 2015-04-16 DIAGNOSIS — K449 Diaphragmatic hernia without obstruction or gangrene: Secondary | ICD-10-CM

## 2015-04-16 NOTE — Progress Notes (Signed)
Subjective:    Patient ID: Derrick ManchesterDaniel F Wheeler, male    DOB: 1971-12-10, 44 y.o.   MRN: [id number]  HPI Patient here today for 2 week follow up on chest tightness and dizziness. He states all issues are about the same as last visit. Recent x-rays and the results were reviewed with patient today. His history was once again reviewed with him and his cardiac and pulmonary workups were also reviewed. We gave him a copy of his endoscopy report. He just does not feel well and up to his normal speed and paced that he is used to feeling. He has not started his cholesterol medicine at this point because we will not resolve the current issues that are happening with him for starting something new and he understands this.      Patient Active Problem List   Diagnosis Date Noted  . Chest pain 02/24/2015   Outpatient Encounter Prescriptions as of 04/16/2015  Medication Sig  . fluticasone (FLONASE) 50 MCG/ACT nasal spray Place 2 sprays into both nostrils daily.  . meclizine (ANTIVERT) 12.5 MG tablet Take 1 tablet (12.5 mg total) by mouth 3 (three) times daily as needed for dizziness.  . Multiple Vitamin (MULTIVITAMIN WITH MINERALS) TABS tablet Take 1 tablet by mouth daily.   . Omega-3 Fatty Acids (FISH OIL PO) Take 1 tablet by mouth every other day.  Marland Kitchen. omeprazole (PRILOSEC) 40 MG capsule Take 1 capsule (40 mg total) by mouth 2 (two) times daily.  . rosuvastatin (CRESTOR) 10 MG tablet Take 1 tablet (10 mg total) by mouth daily. (Patient not taking: Reported on 04/16/2015)  . [DISCONTINUED] doxycycline (VIBRA-TABS) 100 MG tablet Take 1 tablet (100 mg total) by mouth 2 (two) times daily.     Review of Systems  Constitutional: Negative.   HENT: Negative.   Eyes: Negative.   Respiratory: Positive for chest tightness.   Cardiovascular: Negative.   Gastrointestinal: Negative.   Endocrine: Negative.   Genitourinary: Negative.   Musculoskeletal: Negative.   Skin: Negative.   Allergic/Immunologic: Negative.    Neurological: Positive for dizziness. Negative for headaches.  Hematological: Negative.   Psychiatric/Behavioral: Negative.        Objective:   Physical Exam  Constitutional: He is oriented to person, place, and time. He appears well-developed and well-nourished. He appears distressed.  The patient comes in today with his son. He remains somewhat anxious regarding his current health issues. His history was reviewed with him during the visit today.  HENT:  Head: Normocephalic and atraumatic.  Right Ear: External ear normal.  Left Ear: External ear normal.  Nose: Nose normal.  Mouth/Throat: Oropharynx is clear and moist. No oropharyngeal exudate.  Eyes: Conjunctivae and EOM are normal. Pupils are equal, round, and reactive to light. Right eye exhibits no discharge. Left eye exhibits no discharge. No scleral icterus.  Neck: Normal range of motion. Neck supple. No thyromegaly present.  Cardiovascular: Normal rate, regular rhythm and normal heart sounds.   No murmur heard. Pulmonary/Chest: Effort normal and breath sounds normal. No respiratory distress. He has no wheezes. He has no rales. He exhibits no tenderness.  Abdominal: Soft. Bowel sounds are normal. He exhibits no mass. There is tenderness. There is no rebound and no guarding.  There is epigastric tenderness.  Musculoskeletal: Normal range of motion. He exhibits no edema.  Lymphadenopathy:    He has no cervical adenopathy.  Neurological: He is alert and oriented to person, place, and time. No cranial nerve deficit.  Skin: Skin is warm  and dry. No rash noted. No erythema. No pallor.  Psychiatric: He has a normal mood and affect. His behavior is normal. Judgment and thought content normal.  The patient is worried and anxious  Nursing note and vitals reviewed.   BP 100/68 mmHg  Pulse 70  Temp(Src) 98.9 F (37.2 C) (Oral)  Ht  (1.88 m)  Wt 209 lb (94.802 kg)  BMI 26.82 kg/m2       Assessment & Plan:  1. Dizziness  and giddiness -Take meclizine regularly for the next 7-10 day -If dizziness continues and any ophthalmology problems are ruled out, we will arrange for a visit with the neurologist to further evaluate the dizziness  2. Blurred vision, bilateral -Arrange vision with ophthalmologist  3. Abdominal pain, epigastric -Continue to take omeprazole  4. Hiatal hernia -Continue omeprazole  5. Malaise and fatigue  Patient Instructions  Take meclizine REGULARLY. GO GET AN EYE EXAM Try to speak with Dr. Leonia Corona office on Monday or Tuesday and arrange an appointment for you If the eye exam is normal and the dizziness persists we most likely will refer you to the neurologist for further evaluation Avoid heavy lifting pushing pulling and straining We will consider starting cholesterol medication once the current issues have begun clearing    Nyra Capes MD

## 2015-04-16 NOTE — Patient Instructions (Addendum)
Take meclizine REGULARLY. GO GET AN EYE EXAM Try to speak with Dr. Leonia CoronaGrevin office on Monday or Tuesday and arrange an appointment for you If the eye exam is normal and the dizziness persists we most likely will refer you to the neurologist for further evaluation Avoid heavy lifting pushing pulling and straining We will consider starting cholesterol medication once the current issues have begun clearing

## 2015-04-21 NOTE — Addendum Note (Signed)
Addended by: Magdalene RiverBULLINS, Floy Riegler H on: 04/21/2015 12:43 PM   Modules accepted: Orders

## 2015-04-21 NOTE — Addendum Note (Signed)
Addended by: Magdalene RiverBULLINS, Countess Biebel H on: 04/21/2015 01:01 PM   Modules accepted: Orders

## 2015-05-14 ENCOUNTER — Ambulatory Visit: Payer: 59 | Admitting: Family Medicine

## 2015-05-18 ENCOUNTER — Ambulatory Visit (INDEPENDENT_AMBULATORY_CARE_PROVIDER_SITE_OTHER): Payer: 59 | Admitting: Family Medicine

## 2015-05-18 ENCOUNTER — Encounter: Payer: Self-pay | Admitting: Family Medicine

## 2015-05-18 VITALS — BP 110/76 | HR 93 | Temp 98.4°F | Ht 74.0 in | Wt 216.0 lb

## 2015-05-18 DIAGNOSIS — H538 Other visual disturbances: Secondary | ICD-10-CM

## 2015-05-18 DIAGNOSIS — R0789 Other chest pain: Secondary | ICD-10-CM

## 2015-05-18 DIAGNOSIS — K219 Gastro-esophageal reflux disease without esophagitis: Secondary | ICD-10-CM

## 2015-05-18 DIAGNOSIS — R42 Dizziness and giddiness: Secondary | ICD-10-CM

## 2015-05-18 NOTE — Patient Instructions (Addendum)
Will we arrange an appt with Dr Anne HahnWillis for the on going dizziness and blurred vision.  We will call you when your labs come back. Continue to try to walk regularly and watch her diet as closely as possible We will hold the Crestor until your neurological workup is complete Do not take any more meclizine until you see the neurologist

## 2015-05-18 NOTE — Progress Notes (Signed)
Subjective:    Patient ID: Derrick Wheeler, male    DOB: [birth year], 44 y.o.   MRN: [id number]  HPI Patient here today for 4 week follow up on dizziness, abdominal and chest pain, blurred vision, and fatigue. He recently seen Dr Dione BoozeGroat for vision check.        Patient Active Problem List   Diagnosis Date Noted  . Chest pain 02/24/2015   Outpatient Encounter Prescriptions as of 05/18/2015  Medication Sig  . fluticasone (FLONASE) 50 MCG/ACT nasal spray Place 2 sprays into both nostrils daily.  . Multiple Vitamin (MULTIVITAMIN WITH MINERALS) TABS tablet Take 1 tablet by mouth daily.   . Omega-3 Fatty Acids (FISH OIL PO) Take 1 tablet by mouth every other day.  Marland Kitchen. omeprazole (PRILOSEC) 40 MG capsule Take 1 capsule (40 mg total) by mouth 2 (two) times daily.  . meclizine (ANTIVERT) 12.5 MG tablet Take 1 tablet (12.5 mg total) by mouth 3 (three) times daily as needed for dizziness. (Patient not taking: Reported on 05/18/2015)  . rosuvastatin (CRESTOR) 10 MG tablet Take 1 tablet (10 mg total) by mouth daily. (Patient not taking: Reported on 04/16/2015)   No facility-administered encounter medications on file as of 05/18/2015.     Review of Systems  Constitutional: Positive for fatigue.  HENT: Negative.   Eyes: Negative.        Blurred vision  Respiratory: Positive for chest tightness (at times).   Cardiovascular: Negative.   Gastrointestinal: Negative.  Abdominal pain: better with omeprazole.  Endocrine: Negative.   Genitourinary: Negative.   Musculoskeletal: Negative.   Skin: Negative.   Allergic/Immunologic: Negative.   Neurological: Positive for dizziness.  Hematological: Negative.   Psychiatric/Behavioral: Negative.        Objective:   Physical Exam  Constitutional: He is oriented to person, place, and time. He appears well-developed and well-nourished.  HENT:  Head: Normocephalic and atraumatic.  Right Ear: External ear normal.  Left Ear: External ear normal.  Nose:  Nose normal.  Mouth/Throat: Oropharynx is clear and moist. No oropharyngeal exudate.  Eyes: Conjunctivae and EOM are normal. Pupils are equal, round, and reactive to light. Right eye exhibits no discharge. Left eye exhibits no discharge. No scleral icterus.  Neck: Normal range of motion. Neck supple. No thyromegaly present.  Cardiovascular: Normal rate, regular rhythm and intact distal pulses.   No murmur heard. Pulmonary/Chest: Effort normal and breath sounds normal. No respiratory distress. He has no wheezes. He has no rales. He exhibits no tenderness.  Clear anteriorly and posteriorly and no significant chest wall discomfort noted today  Abdominal: Soft. Bowel sounds are normal. He exhibits no mass. There is tenderness. There is no rebound and no guarding.  Minimal epigastric tenderness  Musculoskeletal: Normal range of motion. He exhibits no edema or tenderness.  Lymphadenopathy:    He has no cervical adenopathy.  Neurological: He is alert and oriented to person, place, and time. He has normal reflexes. No cranial nerve deficit.  Skin: Skin is warm and dry. No rash noted.  Psychiatric: He has a normal mood and affect. His behavior is normal. Judgment and thought content normal.  Nursing note and vitals reviewed.  BP 110/76 mmHg  Pulse 93  Temp(Src) 98.4 F (36.9 C) (Oral)  Ht 6\' 2"  (1.88 m)  Wt 216 lb (97.977 kg)  BMI 27.72 kg/m2        Assessment & Plan:  1. Dizziness -The patient continues to have the dizziness with certain movements and he cannot attribute  any improvement with taking meclizine. - Ambulatory referral to Neurology - Thyroid Panel With TSH  2. Blurred vision -He has had a thorough eye exam by the ophthalmologist and was recommended to use reading glasses. - Ambulatory referral to Neurology - Thyroid Panel With TSH  3. Gastroesophageal reflux disease, esophagitis presence not specified -He still has some discomfort with his reflux but this is much better  with taking omeprazole  4. Chest tightness -The chest tightness has improved to the point that is bothering him very little at this point in time.  Patient Instructions  Will we arrange an appt with Dr Anne Hahn for the on going dizziness and blurred vision.  We will call you when your labs come back. Continue to try to walk regularly and watch her diet as closely as possible We will hold the Crestor until your neurological workup is complete Do not take any more meclizine until you see the neurologist   Nyra Capes MD

## 2015-05-19 LAB — THYROID PANEL WITH TSH
Free Thyroxine Index: 1.8 (ref 1.2–4.9)
T3 UPTAKE RATIO: 29 % (ref 24–39)
T4, Total: 6.3 ug/dL (ref 4.5–12.0)
TSH: 1.23 u[IU]/mL (ref 0.450–4.500)

## 2015-05-20 ENCOUNTER — Telehealth: Payer: Self-pay

## 2015-05-20 NOTE — Telephone Encounter (Signed)
-----   Message from Ernestina Pennaonald W Moore, MD sent at 05/19/2015  9:46 PM EDT ----- All thyroid function tests are within normal limits

## 2015-05-20 NOTE — Telephone Encounter (Signed)
[facility]-Thyroid levels normal

## 2015-05-27 ENCOUNTER — Encounter: Payer: Self-pay | Admitting: Family

## 2015-05-27 ENCOUNTER — Telehealth: Payer: Self-pay | Admitting: Family Medicine

## 2015-05-27 ENCOUNTER — Ambulatory Visit (INDEPENDENT_AMBULATORY_CARE_PROVIDER_SITE_OTHER): Payer: 59 | Admitting: Family

## 2015-05-27 VITALS — BP 105/67 | HR 80 | Temp 98.3°F | Ht 74.0 in | Wt 214.0 lb

## 2015-05-27 DIAGNOSIS — R110 Nausea: Secondary | ICD-10-CM

## 2015-05-27 DIAGNOSIS — R42 Dizziness and giddiness: Secondary | ICD-10-CM | POA: Diagnosis not present

## 2015-05-27 DIAGNOSIS — R197 Diarrhea, unspecified: Secondary | ICD-10-CM

## 2015-05-27 LAB — POCT CBC
Granulocyte percent: 69.9 %G (ref 37–80)
HCT, POC: 46.5 % (ref 43.5–53.7)
HEMOGLOBIN: 15.2 g/dL (ref 14.1–18.1)
Lymph, poc: 1.7 (ref 0.6–3.4)
MCH, POC: 28.1 pg (ref 27–31.2)
MCHC: 32.6 g/dL (ref 31.8–35.4)
MCV: 86.1 fL (ref 80–97)
MPV: 7.4 fL (ref 0–99.8)
POC GRANULOCYTE: 5.5 (ref 2–6.9)
POC LYMPH PERCENT: 21.5 %L (ref 10–50)
Platelet Count, POC: 294 10*3/uL (ref 142–424)
RBC: 5.4 M/uL (ref 4.69–6.13)
RDW, POC: 12.8 %
WBC: 7.8 10*3/uL (ref 4.6–10.2)

## 2015-05-27 MED ORDER — PROMETHAZINE HCL 12.5 MG PO TABS: 12.5000 mg | ORAL_TABLET | Freq: Three times a day (TID) | ORAL | Status: DC | PRN

## 2015-05-27 NOTE — Telephone Encounter (Signed)
Appt given for today 

## 2015-05-27 NOTE — Progress Notes (Signed)
   Subjective:    Patient ID: UNNAMED DAZEY, male    DOB: [birth year], 44 y.o.   MRN: [id number]  Diarrhea  This is a new problem. The current episode started today (5 am). The problem occurs more than 10 times per day. The problem has been unchanged. The stool consistency is described as watery and mucous. Associated symptoms include abdominal pain, bloating, chills, headaches, myalgias and vomiting. Pertinent negatives include no coughing, fever or increased  flatus. Nothing aggravates the symptoms. Risk factors include ill contacts. Treatments tried: tums. The treatment provided mild relief.  Dizziness This is a chronic problem. The current episode started 1 to 4 weeks ago. The problem occurs intermittently. The problem has been gradually worsening. Associated symptoms include abdominal pain, chills, headaches, myalgias and vomiting. Pertinent negatives include no coughing or fever.   *Pt has neurologists appointment on Monday for dizziness.    Review of Systems  Constitutional: Positive for chills. Negative for fever.  Respiratory: Negative.  Negative for cough.   Cardiovascular: Negative.   Gastrointestinal: Positive for vomiting, abdominal pain, diarrhea and bloating. Negative for flatus.  Endocrine: Negative.   Genitourinary: Negative.   Musculoskeletal: Positive for myalgias.  Neurological: Positive for dizziness and headaches.  Hematological: Negative.   Psychiatric/Behavioral: Negative.   All other systems reviewed and are negative.      Objective:   Physical Exam  Constitutional: He is oriented to person, place, and time. He appears well-developed and well-nourished. No distress.  HENT:  Head: Normocephalic.  Right Ear: External ear normal.  Left Ear: External ear normal.  Mouth/Throat: Oropharynx is clear and moist.  Eyes: Pupils are equal, round, and reactive to light. Right eye exhibits no discharge. Left eye exhibits no discharge.  Neck: Normal range of motion. Neck  supple. No thyromegaly present.  Cardiovascular: Normal rate, regular rhythm, normal heart sounds and intact distal pulses.   No murmur heard. Pulmonary/Chest: Effort normal and breath sounds normal. No respiratory distress. He has no wheezes.  Abdominal: Soft. Bowel sounds are normal. He exhibits no distension. There is tenderness (mild tenderness in LLQ and RLQ).  Musculoskeletal: Normal range of motion. He exhibits no edema or tenderness.  Neurological: He is alert and oriented to person, place, and time. He has normal reflexes. No cranial nerve deficit.  Skin: Skin is warm and dry. No rash noted. No erythema.  Psychiatric: He has a normal mood and affect. His behavior is normal. Judgment and thought content normal.  Vitals reviewed.     BP 105/67 mmHg  Pulse 80  Temp(Src) 98.3 F (36.8 C) (Oral)  Ht 6\' 2"  (1.88 m)  Wt 214 lb (97.07 kg)  BMI 27.46 kg/m2     Assessment & Plan:  1. Diarrhea - Cdiff NAA+O+P+Stool Culture - POCT CBC  2. Nausea without vomiting - promethazine (PHENERGAN) 12.5 MG tablet; Take 1 tablet (12.5 mg total) by mouth every 8 (eight) hours as needed for nausea or vomiting.  Dispense: 20 tablet; Refill: 0 - POCT CBC  3. Dizziness and giddiness - POCT CBC -Keep neurologists appointment   Good hand hygiene discussed Force fluids Tylenol prn for pain or fever [place] diet [place] precaution discussed Labs pending RTO prn  Jannifer Rodney, FNP

## 2015-05-27 NOTE — Patient Instructions (Signed)
Clostridium Difficile Infection Clostridium difficile (C. difficile) is a bacteria found in the intestinal tract or colon. Under certain conditions, it causes diarrhea and sometimes severe disease. The severe form of the disease is known as pseudomembranous colitis (often called C. difficile colitis). This disease can damage the lining of the colon or cause the colon to become enlarged (toxic megacolon). CAUSES Your colon normally contains many different bacteria, including C. difficile. The balance of bacteria in your colon can change during illness. This is especially true when you take antibiotic medicine. Taking antibiotics may allow the C. difficile to grow, multiply excessively, and make a toxin that then causes illness. The elderly and people with certain medical conditions have a greater risk of getting C. difficile infections. SYMPTOMS  Watery diarrhea.  Fever.  Fatigue.  Loss of appetite.  Nausea.  Abdominal swelling, pain, or tenderness.  Dehydration. DIAGNOSIS Your symptoms may make your caregiver suspect a C. difficile infection, especially if you have used antibiotics in the preceding weeks. However, there are only 2 ways to know for certain whether you have a C. difficile infection:  A lab test that finds the toxin in your stool.  The specific appearance of an abnormality (pseudomembrane) in your colon. This can only be seen by doing a sigmoidoscopy or colonoscopy. These procedures involve passing an instrument through your rectum to look at the inside of your colon. Your caregiver will help determine if these tests are necessary. TREATMENT  Most people are successfully treated with one of two specific antibiotics, usually given by mouth. Other antibiotics you are receiving are stopped if possible.  Intravenous (IV) fluids and correction of electrolyte imbalance may be necessary.  Rarely, surgery may be needed to remove the infected part of the intestines.  Careful  hand washing by you and your caregivers is important to prevent the spread of infection. In the hospital, your caregivers may also put on gowns and gloves to prevent the spread of the C. difficile bacteria. Your room is also cleaned regularly with a solution containing bleach or a product that is known to kill C. difficile. HOME CARE INSTRUCTIONS  Drink enough fluids to keep your urine clear or pale yellow. Avoid milk, caffeine, and alcohol.  Ask your caregiver for specific rehydration instructions.  Try eating small, frequent meals rather than large meals.  Take your antibiotics as directed. Finish them even if you start to feel better.  Do not use medicines to slow diarrhea. This could delay healing or cause complications.  Wash your hands thoroughly after using the bathroom and before preparing food.  Make sure people who live with you wash their hands often, too.  Carefully disinfect all surfaces with a product that contains chlorine bleach. SEEK MEDICAL CARE IF:  Diarrhea persists longer than expected or recurs after completing your course of antibiotic treatment for the C. difficile infection.  You have trouble staying hydrated. SEEK IMMEDIATE MEDICAL CARE IF:  You develop a new fever.  You have increasing abdominal pain or tenderness.  There is blood in your stools, or your stools are dark black and tarry.  You cannot hold down food or liquids. MAKE SURE YOU:  Understand these instructions.  Will watch your condition.  Will get help right away if you are not doing well or get worse. Document Released: 09/13/2005 Document Revised: 04/20/2014 Document Reviewed: 05/12/2011 [facility] Patient Information 2015 [place], [place]. This information is not intended to replace advice given to you by your health care provider. Make sure you  discuss any questions you have with your health care provider. Viral Gastroenteritis Viral gastroenteritis is also known as stomach flu. This  condition affects the stomach and intestinal tract. It can cause sudden diarrhea and vomiting. The illness typically lasts 3 to 8 days. Most people develop an immune response that eventually gets rid of the virus. While this natural response develops, the virus can make you quite ill. CAUSES  Many different viruses can cause gastroenteritis, such as rotavirus or noroviruses. You can catch one of these viruses by consuming contaminated food or water. You may also catch a virus by sharing utensils or other personal items with an infected person or by touching a contaminated surface. SYMPTOMS  The most common symptoms are diarrhea and vomiting. These problems can cause a severe loss of body fluids (dehydration) and a body salt (electrolyte) imbalance. Other symptoms may include:  Fever.  Headache.  Fatigue.  Abdominal pain. DIAGNOSIS  Your caregiver can usually diagnose viral gastroenteritis based on your symptoms and a physical exam. A stool sample may also be taken to test for the presence of viruses or other infections. TREATMENT  This illness typically goes away on its own. Treatments are aimed at rehydration. The most serious cases of viral gastroenteritis involve vomiting so severely that you are not able to keep fluids down. In these cases, fluids must be given through an intravenous line (IV). HOME CARE INSTRUCTIONS   Drink enough fluids to keep your urine clear or pale yellow. Drink small amounts of fluids frequently and increase the amounts as tolerated.  Ask your caregiver for specific rehydration instructions.  Avoid:  Foods high in sugar.  Alcohol.  Carbonated drinks.  Tobacco.  Juice.  Caffeine drinks.  Extremely hot or cold fluids.  Fatty, greasy foods.  Too much intake of anything at one time.  Dairy products until 24 to 48 hours after diarrhea stops.  You may consume probiotics. Probiotics are active cultures of beneficial bacteria. They may lessen the  amount and number of diarrheal stools in adults. Probiotics can be found in yogurt with active cultures and in supplements.  Wash your hands well to avoid spreading the virus.  Only take over-the-counter or prescription medicines for pain, discomfort, or fever as directed by your caregiver. Do not give aspirin to children. Antidiarrheal medicines are not recommended.  Ask your caregiver if you should continue to take your regular prescribed and over-the-counter medicines.  Keep all follow-up appointments as directed by your caregiver. SEEK IMMEDIATE MEDICAL CARE IF:   You are unable to keep fluids down.  You do not urinate at least once every 6 to 8 hours.  You develop shortness of breath.  You notice blood in your stool or vomit. This may look like coffee grounds.  You have abdominal pain that increases or is concentrated in one small area (localized).  You have persistent vomiting or diarrhea.  You have a fever.  The patient is a child younger than 3 months, and he or she has a fever.  The patient is a child older than 3 months, and he or she has a fever and persistent symptoms.  The patient is a child older than 3 months, and he or she has a fever and symptoms suddenly get worse.  The patient is a baby, and he or she has no tears when crying. MAKE SURE YOU:   Understand these instructions.  Will watch your condition.  Will get help right away if you are not doing well or  get worse. Document Released: 12/04/2005 Document Revised: 02/26/2012 Document Reviewed: 09/20/2011 [facility] Patient Information 2015 [place], [place]. This information is not intended to replace advice given to you by your health care provider. Make sure you discuss any questions you have with your health care provider.

## 2015-05-31 ENCOUNTER — Encounter: Payer: Self-pay | Admitting: Neurology

## 2015-05-31 ENCOUNTER — Ambulatory Visit (INDEPENDENT_AMBULATORY_CARE_PROVIDER_SITE_OTHER): Payer: 59 | Admitting: Neurology

## 2015-05-31 VITALS — BP 111/78 | HR 81 | Ht 74.0 in | Wt 212.6 lb

## 2015-05-31 DIAGNOSIS — R42 Dizziness and giddiness: Secondary | ICD-10-CM | POA: Insufficient documentation

## 2015-05-31 HISTORY — DX: Dizziness and giddiness: R42

## 2015-05-31 MED ORDER — TOPIRAMATE 25 MG PO TABS: ORAL_TABLET | ORAL | Status: DC

## 2015-05-31 NOTE — Patient Instructions (Signed)
    Topamax (topiramate) is a seizure medication that has an FDA approval for seizures and for migraine headache. Potential side effects of this medication include weight loss, cognitive slowing, tingling in the fingers and toes, and carbonated drinks will taste bad. If any significant side effects are noted on this drug, please contact our office.  Vertigo Vertigo means you feel like you or your surroundings are moving when they are not. Vertigo can be dangerous if it occurs when you are at work, driving, or performing difficult activities.  CAUSES  Vertigo occurs when there is a conflict of signals sent to your brain from the visual and sensory systems in your body. There are many different causes of vertigo, including:  Infections, especially in the inner ear.  A bad reaction to a drug or misuse of alcohol and medicines.  Withdrawal from drugs or alcohol.  Rapidly changing positions, such as lying down or rolling over in bed.  A migraine headache.  Decreased blood flow to the brain.  Increased pressure in the brain from a head injury, infection, tumor, or bleeding. SYMPTOMS  You may feel as though the world is spinning around or you are falling to the ground. Because your balance is upset, vertigo can cause nausea and vomiting. You may have involuntary eye movements (nystagmus). DIAGNOSIS  Vertigo is usually diagnosed by physical exam. If the cause of your vertigo is unknown, your caregiver may perform imaging tests, such as an MRI scan (magnetic resonance imaging). TREATMENT  Most cases of vertigo resolve on their own, without treatment. Depending on the cause, your caregiver may prescribe certain medicines. If your vertigo is related to body position issues, your caregiver may recommend movements or procedures to correct the problem. In rare cases, if your vertigo is caused by certain inner ear problems, you may need surgery. HOME CARE INSTRUCTIONS   Follow your caregiver's  instructions.  Avoid driving.  Avoid operating heavy machinery.  Avoid performing any tasks that would be dangerous to you or others during a vertigo episode.  Tell your caregiver if you notice that certain medicines seem to be causing your vertigo. Some of the medicines used to treat vertigo episodes can actually make them worse in some people. SEEK IMMEDIATE MEDICAL CARE IF:   Your medicines do not relieve your vertigo or are making it worse.  You develop problems with talking, walking, weakness, or using your arms, hands, or legs.  You develop severe headaches.  Your nausea or vomiting continues or gets worse.  You develop visual changes.  A family member notices behavioral changes.  Your condition gets worse. MAKE SURE YOU:  Understand these instructions.  Will watch your condition.  Will get help right away if you are not doing well or get worse. Document Released: 09/13/2005 Document Revised: 02/26/2012 Document Reviewed: 06/22/2011 [employer] Patient Information 2015 [employer]. This information is not intended to replace advice given to you by your health care provider. Make sure you discuss any questions you have with your health care provider.  

## 2015-05-31 NOTE — Progress Notes (Signed)
Reason for visit: Dizziness  Referring physician: Dr. [name] is a 44 y.o. male  History of present illness:  Derrick Wheeler is a 44 year old right-handed white male with a history of dizziness that has been present since March 2016. The patient indicates that he had a vasovagal syncopal event that occurred in September 2015 while sitting on the toilet. The patient was nauseated and also had diarrhea, and then became sweaty and dizzy, and lost consciousness. The patient hit his head when he fell off the toilet. The patient had done relatively well until March 2016. The patient began having some lightheaded sensations associated with some vertigo. The patient is having daily events that may last anywhere from 5 minutes to an hour. The episodes of dizziness may be associated with headache behind the eyes. He has some photophobia, no phonophobia. He does have some neck stiffness, he reports no nausea or vomiting. He will occasionally have some scintillating visual changes in the periphery the vision bilaterally. He denies any numbness or weakness on the face, arms, or legs. The denies any slurred speech, or difficulty with swallowing. He does not have any double vision, but he does have some blurring of vision. He was recently seen by ophthalmology, and glasses were prescribed. He is sent to this office for an evaluation. The dizziness is not positional, may be present with lying, sitting, and standing.  Past Medical History  Diagnosis Date  . Scoliosis   . GERD (gastroesophageal reflux disease)   . Compression fracture of L1 lumbar vertebra   . Cervical spine fracture   . Hyperlipidemia   . Hypoglycemia   . Hiatal hernia   . Vertigo 05/31/2015    Past Surgical History  Procedure Laterality Date  . Removal of testicular mass    . Knee surgery    . Arthrscopic knee surgery x8    . Left wrist repair fracture x2    . Lumbar fracture repair x6      Family History  Problem  Relation Age of Onset  . Asthma Mother   . Arthritis Mother   . Stroke Mother   . Heart disease Father   . Colon cancer Neg Hx   . Esophageal cancer Neg Hx   . Rectal cancer Neg Hx   . Stomach cancer Neg Hx   . Cancer Maternal Grandfather     Social history:  reports that he quit smoking about 5 months ago. His smoking use included Cigarettes. He has a 10 pack-year smoking history. He has never used smokeless tobacco. He reports that he drinks alcohol. He reports that he does not use illicit drugs.  Medications:  Prior to Admission medications   Medication Sig Start Date End Date Taking? Authorizing Provider  fluticasone (FLONASE) 50 MCG/ACT nasal spray Place 2 sprays into both nostrils daily. 03/30/15  Yes Ernestina Penna, MD  Multiple Vitamin (MULTIVITAMIN WITH MINERALS) TABS tablet Take 1 tablet by mouth daily.    Yes Historical Provider, MD  Omega-3 Fatty Acids (FISH OIL PO) Take 1 tablet by mouth every other day.   Yes Historical Provider, MD  omeprazole (PRILOSEC) 40 MG capsule Take 1 capsule (40 mg total) by mouth 2 (two) times daily. Patient taking differently: Take 40 mg by mouth daily.  02/15/15  Yes Ernestina Penna, MD  meclizine (ANTIVERT) 12.5 MG tablet Take 1 tablet (12.5 mg total) by mouth 3 (three) times daily as needed for dizziness. Patient not taking: Reported on 05/18/2015  03/30/15   Ernestina Penna, MD  rosuvastatin (CRESTOR) 10 MG tablet Take 1 tablet (10 mg total) by mouth daily. Patient not taking: Reported on 04/16/2015 02/09/15   Ernestina Penna, MD      Allergies  Allergen Reactions  . Meloxicam Shortness Of Breath and Rash  . Other     Bee sting  . Codeine Rash  . Sulfa Antibiotics Rash    ROS:  Out of a complete 14 system review of symptoms, the patient complains only of the following symptoms, and all other reviewed systems are negative.  Dizziness Blurred vision Joint pain Syncope Decreased energy  Blood pressure 111/78, pulse 81, height 6\' 2"   (1.88 m), weight 212 lb 9.6 oz (96.435 kg).  Physical Exam  General: The patient is alert and cooperative at the time of the examination.  Eyes: Pupils are equal, round, and reactive to light. Discs are flat bilaterally.  Neck: The neck is supple, no carotid bruits are noted.  Respiratory: The respiratory examination is clear.  Cardiovascular: The cardiovascular examination reveals a regular rate and rhythm, no obvious murmurs or rubs are noted.  Skin: Extremities are without significant edema.  Neurologic Exam  Mental status: The patient is alert and oriented x 3 at the time of the examination. The patient has apparent normal recent and remote memory, with an apparently normal attention span and concentration ability.  Cranial nerves: Facial symmetry is present. There is good sensation of the face to pinprick and soft touch bilaterally. The strength of the facial muscles and the muscles to head turning and shoulder shrug are normal bilaterally. Speech is well enunciated, no aphasia or dysarthria is noted. Extraocular movements are full. Visual fields are full. The tongue is midline, and the patient has symmetric elevation of the soft palate. No obvious hearing deficits are noted.  Motor: The motor testing reveals 5 over 5 strength of all 4 extremities. Good symmetric motor tone is noted throughout.  Sensory: Sensory testing is intact to pinprick, soft touch, vibration sensation, and position sense on all 4 extremities. No evidence of extinction is noted.  Coordination: Cerebellar testing reveals good finger-nose-finger and heel-to-shin bilaterally.  Gait and station: Gait is normal. Tandem gait is normal. Romberg is negative. No drift is seen.  Reflexes: Deep tendon reflexes are symmetric and normal bilaterally. Toes are downgoing bilaterally.   Assessment/Plan:  1. Episodic dizziness, headache  2. Vasovagal syncope  The patient is having episodes of dizziness and headache.  The patient will undergo MRI scan of the brain for further evaluation. The patient could potentially have migraine type headaches. The patient will be placed on Topamax, working up on the dose. He will follow-up in 3 or 4 months. The clinical examination today was completely normal.  Marlan Palau MD 05/31/2015 7:34 PM  [facility] [address] [place], [place] [address]  Phone [phone] Fax [phone]

## 2015-06-01 ENCOUNTER — Encounter: Payer: Self-pay | Admitting: *Deleted

## 2015-06-03 ENCOUNTER — Ambulatory Visit (INDEPENDENT_AMBULATORY_CARE_PROVIDER_SITE_OTHER): Payer: 59

## 2015-06-03 DIAGNOSIS — R42 Dizziness and giddiness: Secondary | ICD-10-CM

## 2015-06-04 ENCOUNTER — Telehealth: Payer: Self-pay | Admitting: Neurology

## 2015-06-04 NOTE — Telephone Encounter (Signed)
I called the patient. It looks like the results are still "in process." I promised him I would pass this on to Dr. Anne Hahn and he would call with the results.

## 2015-06-04 NOTE — Telephone Encounter (Signed)
  I Called the patient. The MRI the brain is essentially normal. The patient likely has migraine headaches, we will continue increase the Topamax dosing.  MRI brain 06/03/15:  IMPRESSION: This is noncontrasted MRI of the brain shows the following a couple punctate T2/flair hyperintense foci in the deep white matter of each hemisphere. As a nonspecific finding most likely represents minimal age-related microvascular ischemic changes. There are no acute findings.

## 2015-06-04 NOTE — Telephone Encounter (Signed)
Please refer to telephone note from this date. I have already called the patient.

## 2015-06-04 NOTE — Telephone Encounter (Signed)
Patient called and requested results from his MRI. Please call and advise.

## 2015-06-08 ENCOUNTER — Telehealth: Payer: Self-pay | Admitting: Family Medicine

## 2015-06-08 NOTE — Telephone Encounter (Signed)
Pt informed last Tetanus was 2008 Pt will come in to get updated tetanus

## 2015-06-09 ENCOUNTER — Ambulatory Visit (INDEPENDENT_AMBULATORY_CARE_PROVIDER_SITE_OTHER): Payer: 59

## 2015-06-09 DIAGNOSIS — Z23 Encounter for immunization: Secondary | ICD-10-CM

## 2015-07-06 ENCOUNTER — Other Ambulatory Visit: Payer: Self-pay | Admitting: Family Medicine

## 2015-07-20 ENCOUNTER — Telehealth: Payer: Self-pay

## 2015-07-20 MED ORDER — OMEPRAZOLE 40 MG PO CPDR: 40.0000 mg | DELAYED_RELEASE_CAPSULE | Freq: Every day | ORAL | Status: DC

## 2015-07-20 NOTE — Telephone Encounter (Signed)
Pt to take omeparzole 40 mg daily. If symptoms worsen pt NTBS to change medications

## 2015-07-20 NOTE — Telephone Encounter (Signed)
Insurance denied prior authorization for Omeparzole 40 mg BID   Only allowed 1 capsule per day

## 2015-07-20 NOTE — Telephone Encounter (Signed)
Patient aware and new rx has been sent into pharmacy for daily.

## 2015-07-21 ENCOUNTER — Telehealth: Payer: Self-pay | Admitting: Family Medicine

## 2015-07-21 ENCOUNTER — Ambulatory Visit (INDEPENDENT_AMBULATORY_CARE_PROVIDER_SITE_OTHER): Payer: 59 | Admitting: Family Medicine

## 2015-07-21 VITALS — BP 114/71 | HR 68 | Temp 97.1°F | Ht 74.0 in | Wt 215.4 lb

## 2015-07-21 DIAGNOSIS — H538 Other visual disturbances: Secondary | ICD-10-CM

## 2015-07-21 DIAGNOSIS — R42 Dizziness and giddiness: Secondary | ICD-10-CM

## 2015-07-21 LAB — POCT CBC
Granulocyte percent: 59.8 %G (ref 37–80)
HCT, POC: 51.1 % (ref 43.5–53.7)
Hemoglobin: 15.6 g/dL (ref 14.1–18.1)
Lymph, poc: 2.2 (ref 0.6–3.4)
MCH: 26.4 pg — AB (ref 27–31.2)
MCHC: 30.6 g/dL — AB (ref 31.8–35.4)
MCV: 86.4 fL (ref 80–97)
MPV: 7.4 fL (ref 0–99.8)
POC GRANULOCYTE: 3.9 (ref 2–6.9)
POC LYMPH PERCENT: 34.6 %L (ref 10–50)
Platelet Count, POC: 278 10*3/uL (ref 142–424)
RBC: 5.92 M/uL (ref 4.69–6.13)
RDW, POC: 13.3 %
WBC: 6.5 10*3/uL (ref 4.6–10.2)

## 2015-07-21 NOTE — Progress Notes (Signed)
Subjective:    Patient ID: Derrick Wheeler, male    DOB: March 11, 1971, 44 y.o.   MRN: [id number]  HPI  Patient here today with complaints of dizziness and blurred vision for one week. The patient saw the neurologist in June and had an MRI noncontrast of the head. It was an essentially normal MRI. The neurologist working diagnosis is migraine headache. He is place him on Topamax for this but the patient has been unable to increase the Topamax beyond 1 pill daily because of the side effects of taking it the next day. He says he's been taking drinking plenty of fluids and water and watching his sugar intake very carefully. He is taking his omeprazole regularly. The patient continues to have this intermittent dizziness and blurred vision and it has been going on for about a week and a half total time. He checks his blood pressures at home and they are running in the 110-120 range over the 69-85 range. His blood sugars are usually running in the low 100s fasting and 2 hours after eating. When he gets overheated he feels worse and sometimes feels like he is going to pass out. He says he is watching his diet closely and drinking plenty of fluids. His return appointment to see the neurologist is September 12.         Patient Active Problem List   Diagnosis Date Noted  . Vertigo 05/31/2015  . Chest pain 02/24/2015   Outpatient Encounter Prescriptions as of 07/21/2015  Medication Sig  . Multiple Vitamin (MULTIVITAMIN WITH MINERALS) TABS tablet Take 1 tablet by mouth daily.   . Omega-3 Fatty Acids (FISH OIL PO) Take 1 tablet by mouth every other day.  Marland Kitchen omeprazole (PRILOSEC) 40 MG capsule Take 1 capsule (40 mg total) by mouth daily.  . meclizine (ANTIVERT) 12.5 MG tablet Take 1 tablet (12.5 mg total) by mouth 3 (three) times daily as needed for dizziness. (Patient not taking: Reported on 05/18/2015)  . rosuvastatin (CRESTOR) 10 MG tablet Take 1 tablet (10 mg total) by mouth daily. (Patient not taking:  Reported on 04/16/2015)  . topiramate (TOPAMAX) 25 MG tablet 1 tablet at night for one week, then take 2 tablets at night (Patient not taking: Reported on 07/21/2015)  . [DISCONTINUED] fluticasone (FLONASE) 50 MCG/ACT nasal spray Place 2 sprays into both nostrils daily.   No facility-administered encounter medications on file as of 07/21/2015.     Review of Systems  Constitutional: Negative.   HENT: Negative.   Eyes: Positive for visual disturbance.       Blurred Vision   Respiratory: Negative.   Cardiovascular: Negative.   Gastrointestinal: Negative.   Endocrine: Negative.   Genitourinary: Negative.   Musculoskeletal: Negative.   Skin: Negative.   Allergic/Immunologic: Negative.   Neurological: Positive for dizziness.  Hematological: Negative.   Psychiatric/Behavioral: Negative.        Objective:   Physical Exam  Constitutional: He is oriented to person, place, and time. He appears well-developed and well-nourished. He appears distressed.  HENT:  Head: Normocephalic and atraumatic.  Right Ear: External ear normal.  Left Ear: External ear normal.  Nose: Nose normal.  Mouth/Throat: Oropharynx is clear and moist. No oropharyngeal exudate.  Eyes: Conjunctivae and EOM are normal. Pupils are equal, round, and reactive to light. Right eye exhibits no discharge. Left eye exhibits no discharge. No scleral icterus.  Neck: Normal range of motion. Neck supple. No thyromegaly present.  Cardiovascular: Normal rate, regular rhythm and normal heart sounds.  No murmur heard. Pulmonary/Chest: Effort normal and breath sounds normal. No respiratory distress. He has no wheezes. He has no rales. He exhibits no tenderness.  Abdominal: Bowel sounds are normal.  Musculoskeletal: Normal range of motion. He exhibits no edema.  Lymphadenopathy:    He has no cervical adenopathy.  Neurological: He is alert and oriented to person, place, and time.  Skin: Skin is warm and dry. No rash noted.    Psychiatric: He has a normal mood and affect. His behavior is normal. Judgment and thought content normal.  Somewhat anxious about his condition  Nursing note and vitals reviewed.  BP 114/71 mmHg  Pulse 68  Temp(Src) 97.1 F (36.2 C) (Oral)  Ht $R'6\' 2"'ka$  (1.88 m)  Wt 215 lb 6.4 oz (97.705 kg)  BMI 27.64 kg/m2        Assessment & Plan:  1. Dizziness -When results are back we'll call and discuss with the neurologist regarding any other thoughts or treatment ideas for this patient -A should should try to take the Topamax more regularly and maybe should go back to one half tablet daily for a few days and then go up to 1 tablet daily and take it regularly as directed by the neurologist - POCT CBC - BMP8+EGFR  2. Blurred vision -He is wearing reading glasses now and has had a thorough eye exam. - POCT CBC - BMP8+EGFR  Patient Instructions  Continue to take Topamax as directed. Maybe try one half pill daily for a few days and then go back up to 1 by mouth and see if you can develop a better tolerance for the medication. Continue to drink plenty of fluids and monitor blood sugars and blood pressures at home When the lab work is returned I will call and speak with the neurologist and maybe try to get an earlier appointment for follow-up with him   Arrie Senate MD

## 2015-07-21 NOTE — Telephone Encounter (Signed)
TC back to pt, he was wanting to know if [facility] & Dr. Christell Constant could see him any sooner today. Asher Muir looked at their schedule & I informed pt of her suggestion.

## 2015-07-21 NOTE — Patient Instructions (Signed)
Continue to take Topamax as directed. Maybe try one half pill daily for a few days and then go back up to 1 by mouth and see if you can develop a better tolerance for the medication. Continue to drink plenty of fluids and monitor blood sugars and blood pressures at home When the lab work is returned I will call and speak with the neurologist and maybe try to get an earlier appointment for follow-up with him

## 2015-07-23 ENCOUNTER — Telehealth: Payer: Self-pay | Admitting: Neurology

## 2015-07-23 LAB — BMP8+EGFR
BUN / CREAT RATIO: 13 (ref 9–20)
BUN: 12 mg/dL (ref 6–24)
CHLORIDE: 100 mmol/L (ref 97–108)
CO2: 25 mmol/L (ref 18–29)
Calcium: 9.6 mg/dL (ref 8.7–10.2)
Creatinine, Ser: 0.94 mg/dL (ref 0.76–1.27)
GFR, EST AFRICAN AMERICAN: 113 mL/min/{1.73_m2} (ref >59)
GFR, EST NON AFRICAN AMERICAN: 98 mL/min/{1.73_m2} (ref >59)
Glucose: 83 mg/dL (ref 65–99)
Potassium: 4.8 mmol/L (ref 3.5–5.2)
Sodium: 142 mmol/L (ref 134–144)

## 2015-07-23 MED ORDER — NORTRIPTYLINE HCL 10 MG PO CAPS: ORAL_CAPSULE | ORAL | Status: DC

## 2015-07-23 NOTE — Telephone Encounter (Signed)
Dr Rudi Heap would like to speak with Dr Anne Hahn regarding Beatrice Lecher he will be in the office all day today but will be at lunch between 1-2pm

## 2015-07-23 NOTE — Telephone Encounter (Signed)
I called Dr. Christell Constant, the patient is not tolerating even a low dose of the Topamax, will stop the medication, I will consider switching him to nortriptyline in low dose, going up gradually.   I called patient. The patient could not tolerate the Topamax secondary to fatigue and drowsiness. We will stop the medication. I will start him on low-dose nortriptyline in low dose, gradually working up on the medication. He will contact me if he is not tolerating the medication. I discussed the results of the brain MRI study again with him, this study was essentially normal.

## 2015-07-28 ENCOUNTER — Telehealth: Payer: Self-pay | Admitting: Family Medicine

## 2015-07-28 NOTE — Telephone Encounter (Signed)
Faxed list of allergies to Dr. Marquita Palms

## 2015-08-19 HISTORY — PX: MOUTH SURGERY: SHX715

## 2015-08-30 ENCOUNTER — Telehealth: Payer: Self-pay

## 2015-08-30 ENCOUNTER — Ambulatory Visit: Payer: 59 | Admitting: Neurology

## 2015-08-30 NOTE — Telephone Encounter (Signed)
Patient called to cancel appointment about 2 hours prior to appointment.

## 2015-09-09 ENCOUNTER — Telehealth: Payer: Self-pay | Admitting: Neurology

## 2015-09-09 ENCOUNTER — Telehealth: Payer: Self-pay

## 2015-09-09 ENCOUNTER — Ambulatory Visit: Payer: 59 | Admitting: Neurology

## 2015-09-09 NOTE — Telephone Encounter (Signed)
Patient missed appt today-no show fee waived per [place]. Dr Anne Hahn booked until 3/16. Please call and advise a time for pt to be seen [phone]

## 2015-09-09 NOTE — Telephone Encounter (Signed)
Patient called to cancel follow up appointment the day of appointment.

## 2015-09-13 NOTE — Telephone Encounter (Signed)
I called the patient. Appointment scheduled Thursday (09/16/15) at 12 PM.

## 2015-09-16 ENCOUNTER — Ambulatory Visit (INDEPENDENT_AMBULATORY_CARE_PROVIDER_SITE_OTHER): Payer: 59 | Admitting: Neurology

## 2015-09-16 ENCOUNTER — Encounter: Payer: Self-pay | Admitting: Neurology

## 2015-09-16 VITALS — BP 110/72 | HR 80 | Ht 74.0 in | Wt 211.5 lb

## 2015-09-16 DIAGNOSIS — R42 Dizziness and giddiness: Secondary | ICD-10-CM

## 2015-09-16 DIAGNOSIS — G43019 Migraine without aura, intractable, without status migrainosus: Secondary | ICD-10-CM

## 2015-09-16 HISTORY — DX: Migraine without aura, intractable, without status migrainosus: G43.019

## 2015-09-16 MED ORDER — DIVALPROEX SODIUM 500 MG PO DR TAB: DELAYED_RELEASE_TABLET | ORAL | Status: DC

## 2015-09-16 MED ORDER — RIZATRIPTAN BENZOATE 10 MG PO TBDP: 10.0000 mg | ORAL_TABLET | Freq: Three times a day (TID) | ORAL | Status: DC | PRN

## 2015-09-16 NOTE — Progress Notes (Signed)
Reason for visit: Migraine headache  Derrick Wheeler is an 44 y.o. male  History of present illness:  Derrick Wheeler is a 43 year old right-handed white male with a history of episodes of dizziness and vertigo associated with mild headaches. The patient may at times have more significant headaches that come up in the back of head spreading forward. The other headaches are dull, around the back of the eye. The patient may have nausea, but no vomiting with the severe headaches. He may experience photophobia and phonophobia. The patient is having some form of dizziness and headache 4 or 5 times a week. The patient has been placed on Topamax, but he could not tolerate the medication. He was switched nortriptyline, but never went higher than a 10 mg capsule at night. He has developed gastroesophageal reflux disease, and he has concerns that the nortriptyline may be worsening this. The patient is on B12 supplementation. He returns this office for an evaluation. MRI of the brain was done, this showed only a couple of tiny white matter lesions, fully consistent with the history of migraine headache. He comes back for an evaluation.  Past Medical History  Diagnosis Date  . Scoliosis   . GERD (gastroesophageal reflux disease)   . Compression fracture of L1 lumbar vertebra   . Cervical spine fracture   . Hyperlipidemia   . Hypoglycemia   . Hiatal hernia   . Vertigo 05/31/2015  . Common migraine with intractable migraine 09/16/2015    Past Surgical History  Procedure Laterality Date  . Removal of testicular mass    . Knee surgery    . Arthrscopic knee surgery x8    . Left wrist repair fracture x2    . Lumbar fracture repair x6      Family History  Problem Relation Age of Onset  . Asthma Mother   . Arthritis Mother   . Stroke Mother   . Heart disease Father   . Colon cancer Neg Hx   . Esophageal cancer Neg Hx   . Rectal cancer Neg Hx   . Stomach cancer Neg Hx   . Cancer Maternal  Grandfather     Social history:  reports that he has been smoking Cigarettes.  He has a 5 pack-year smoking history. He has never used smokeless tobacco. He reports that he does not drink alcohol or use illicit drugs.    Allergies  Allergen Reactions  . Meloxicam Shortness Of Breath and Rash  . Other     Bee sting  . Codeine Rash  . Sulfa Antibiotics Rash    Medications:  Prior to Admission medications   Medication Sig Start Date End Date Taking? Authorizing Provider  Multiple Vitamin (MULTIVITAMIN WITH MINERALS) TABS tablet Take 1 tablet by mouth daily.    Yes Historical Provider, MD  nortriptyline (PAMELOR) 10 MG capsule Take one capsule at night for one week, then take 2 capsules at night for one week, then take 3 capsules at night 07/23/15  Yes York Spaniel, MD  Omega-3 Fatty Acids (FISH OIL PO) Take 1 tablet by mouth every other day.   Yes Historical Provider, MD  omeprazole (PRILOSEC) 40 MG capsule Take 1 capsule (40 mg total) by mouth daily. 07/20/15  Yes Ernestina Penna, MD  rosuvastatin (CRESTOR) 10 MG tablet Take 1 tablet (10 mg total) by mouth daily. Patient not taking: Reported on 04/16/2015 02/09/15   Ernestina Penna, MD    ROS:  Out of a complete 14  system review of symptoms, the patient complains only of the following symptoms, and all other reviewed systems are negative.  Fatigue Light sensitivity, blurred vision Heat intolerance Daytime sleepiness, shift work Joint pain, back pain, neck pain Headache, dizziness  Blood pressure 110/72, pulse 80, height  (1.88 m), weight 211 lb 8 oz (95.936 kg).  Physical Exam  General: The patient is alert and cooperative at the time of the examination.  Skin: No significant peripheral edema is noted.   Neurologic Exam  Mental status: The patient is alert and oriented x 3 at the time of the examination. The patient has apparent normal recent and remote memory, with an apparently normal attention span and concentration  ability.   Cranial nerves: Facial symmetry is present. Speech is normal, no aphasia or dysarthria is noted. Extraocular movements are full. Visual fields are full.  Motor: The patient has good strength in all 4 extremities.  Sensory examination: Soft touch sensation is symmetric on the face, arms, and legs.  Coordination: The patient has good finger-nose-finger and heel-to-shin bilaterally.  Gait and station: The patient has a normal gait. Tandem gait is normal. Romberg is negative. No drift is seen.  Reflexes: Deep tendon reflexes are symmetric.   Assessment/Plan:  1. Migraine headache  The patient is having recurrent migraine headaches. The patient will be taken off of the nortriptyline given the gastroesophageal reflux disease. He will be switched to Depakote taking 500 mg daily for one week, then go to 500 mg twice daily. He was given Maxalt to take if needed for the headache. He will follow-up in 4 months, sooner if needed.  Marlan Palau MD 09/16/2015 7:14 PM  [facility] [address] [place], [place] [address]  Phone [phone] Fax [phone]

## 2015-09-16 NOTE — Patient Instructions (Addendum)
   We will start Depakote to prevent the headache and you can use Maxalt when the headache occurs.   Migraine Headache A migraine headache is an intense, throbbing pain on one or both sides of your head. A migraine can last for 30 minutes to several hours. CAUSES  The exact cause of a migraine headache is not always known. However, a migraine may be caused when nerves in the brain become irritated and release chemicals that cause inflammation. This causes pain. Certain things may also trigger migraines, such as:  Alcohol.  Smoking.  Stress.  Menstruation.  Aged cheeses.  Foods or drinks that contain nitrates, glutamate, aspartame, or tyramine.  Lack of sleep.  Chocolate.  Caffeine.  Hunger.  Physical exertion.  Fatigue.  Medicines used to treat chest pain (nitroglycerine), birth control pills, estrogen, and some blood pressure medicines. SIGNS AND SYMPTOMS  Pain on one or both sides of your head.  Pulsating or throbbing pain.  Severe pain that prevents daily activities.  Pain that is aggravated by any physical activity.  Nausea, vomiting, or both.  Dizziness.  Pain with exposure to bright lights, loud noises, or activity.  General sensitivity to bright lights, loud noises, or smells. Before you get a migraine, you may get warning signs that a migraine is coming (aura). An aura may include:  Seeing flashing lights.  Seeing bright spots, halos, or zigzag lines.  Having tunnel vision or blurred vision.  Having feelings of numbness or tingling.  Having trouble talking.  Having muscle weakness. DIAGNOSIS  A migraine headache is often diagnosed based on:  Symptoms.  Physical exam.  A CT scan or MRI of your head. These imaging tests cannot diagnose migraines, but they can help rule out other causes of headaches. TREATMENT Medicines may be given for pain and nausea. Medicines can also be given to help prevent recurrent migraines.  HOME CARE  INSTRUCTIONS  Only take over-the-counter or prescription medicines for pain or discomfort as directed by your health care provider. The use of long-term narcotics is not recommended.  Lie down in a dark, quiet room when you have a migraine.  Keep a journal to find out what may trigger your migraine headaches. For example, write down:  What you eat and drink.  How much sleep you get.  Any change to your diet or medicines.  Limit alcohol consumption.  Quit smoking if you smoke.  Get 7-9 hours of sleep, or as recommended by your health care provider.  Limit stress.  Keep lights dim if bright lights bother you and make your migraines worse. SEEK IMMEDIATE MEDICAL CARE IF:   Your migraine becomes severe.  You have a fever.  You have a stiff neck.  You have vision loss.  You have muscular weakness or loss of muscle control.  You start losing your balance or have trouble walking.  You feel faint or pass out.  You have severe symptoms that are different from your first symptoms. MAKE SURE YOU:   Understand these instructions.  Will watch your condition.  Will get help right away if you are not doing well or get worse. Document Released: 12/04/2005 Document Revised: 04/20/2014 Document Reviewed: 08/11/2013 [facility] Patient Information 2015 [place], [place]. This information is not intended to replace advice given to you by your health care provider. Make sure you discuss any questions you have with your health care provider.

## 2015-10-13 ENCOUNTER — Other Ambulatory Visit: Payer: Self-pay | Admitting: Neurology

## 2015-10-13 NOTE — Telephone Encounter (Signed)
York Spanielharles K Willis, MD at 07/23/2015 12:18 PM     Status: Signed       Expand All Collapse All   I called Dr. Christell ConstantMoore, the patient is not tolerating even a low dose of the Topamax, will stop the medication

## 2015-10-24 IMAGING — CR DG SHOULDER 2+V*L*
3 series · 3 of 3 positions shown · non-contrast
Comparison: None.

CLINICAL DATA: Left shoulder pain.

EXAM:
LEFT SHOULDER - 2+ VIEW

[view not recorded (1 of 3)]
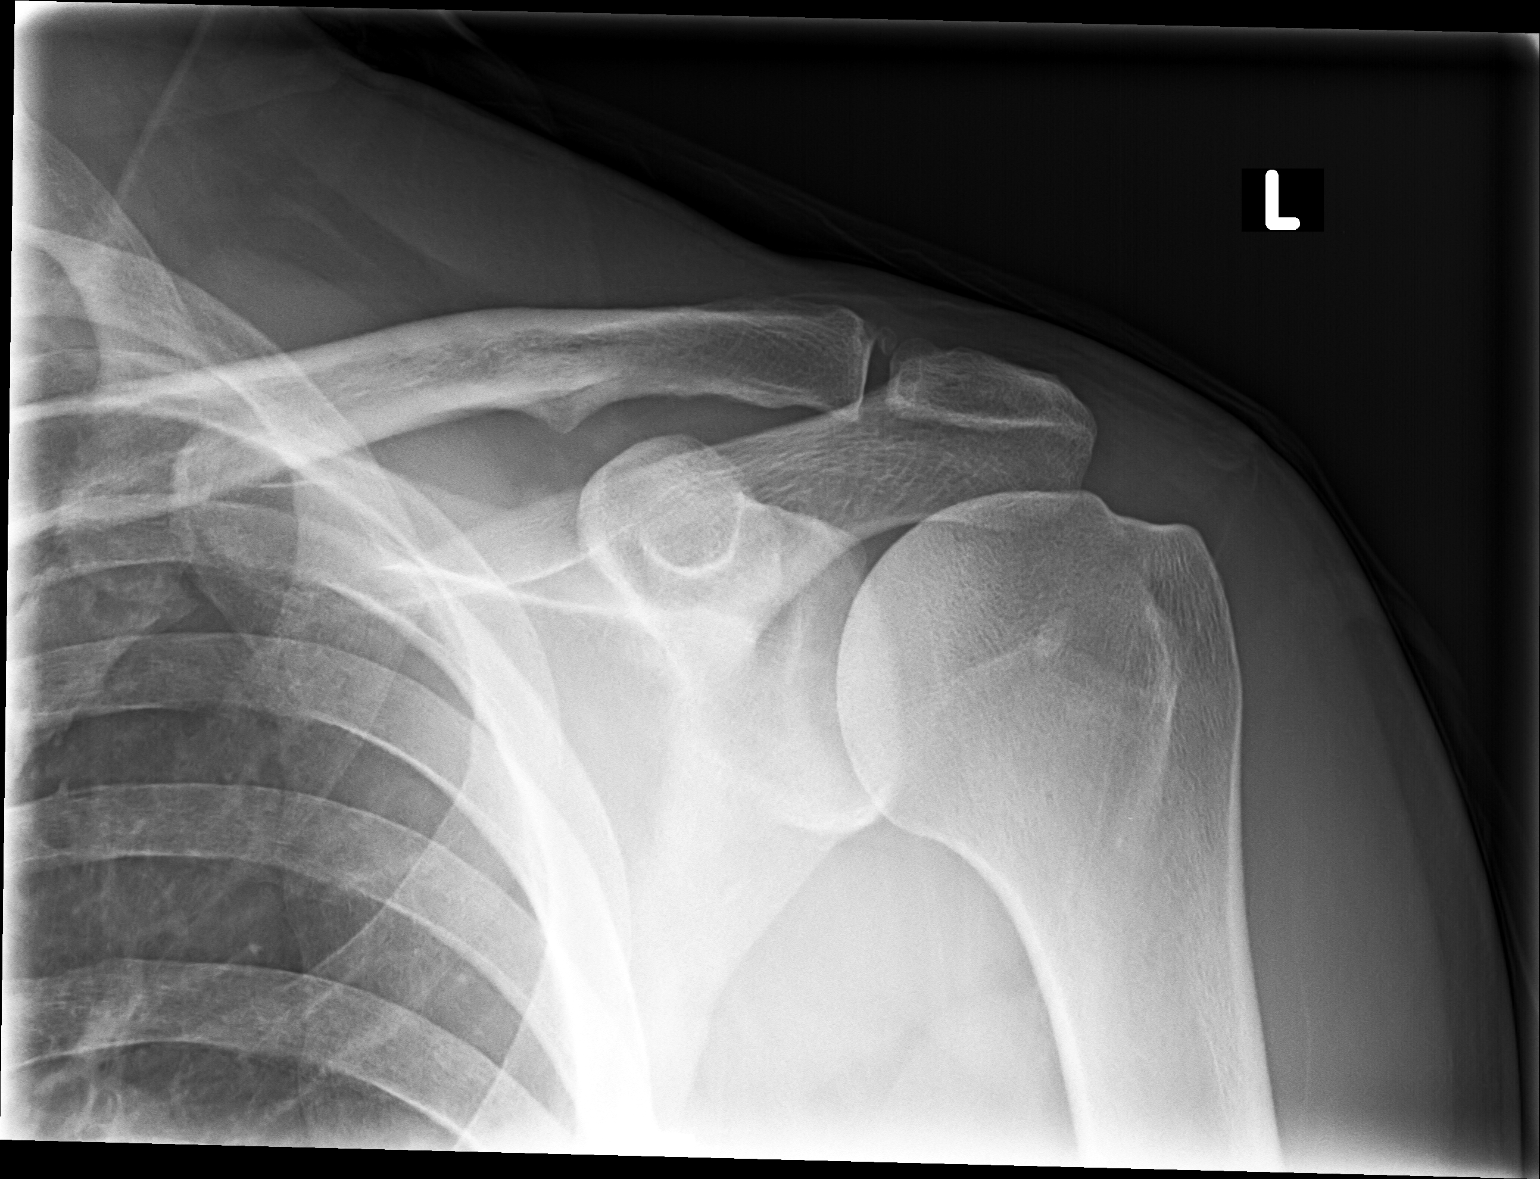

[view not recorded (2 of 3)]
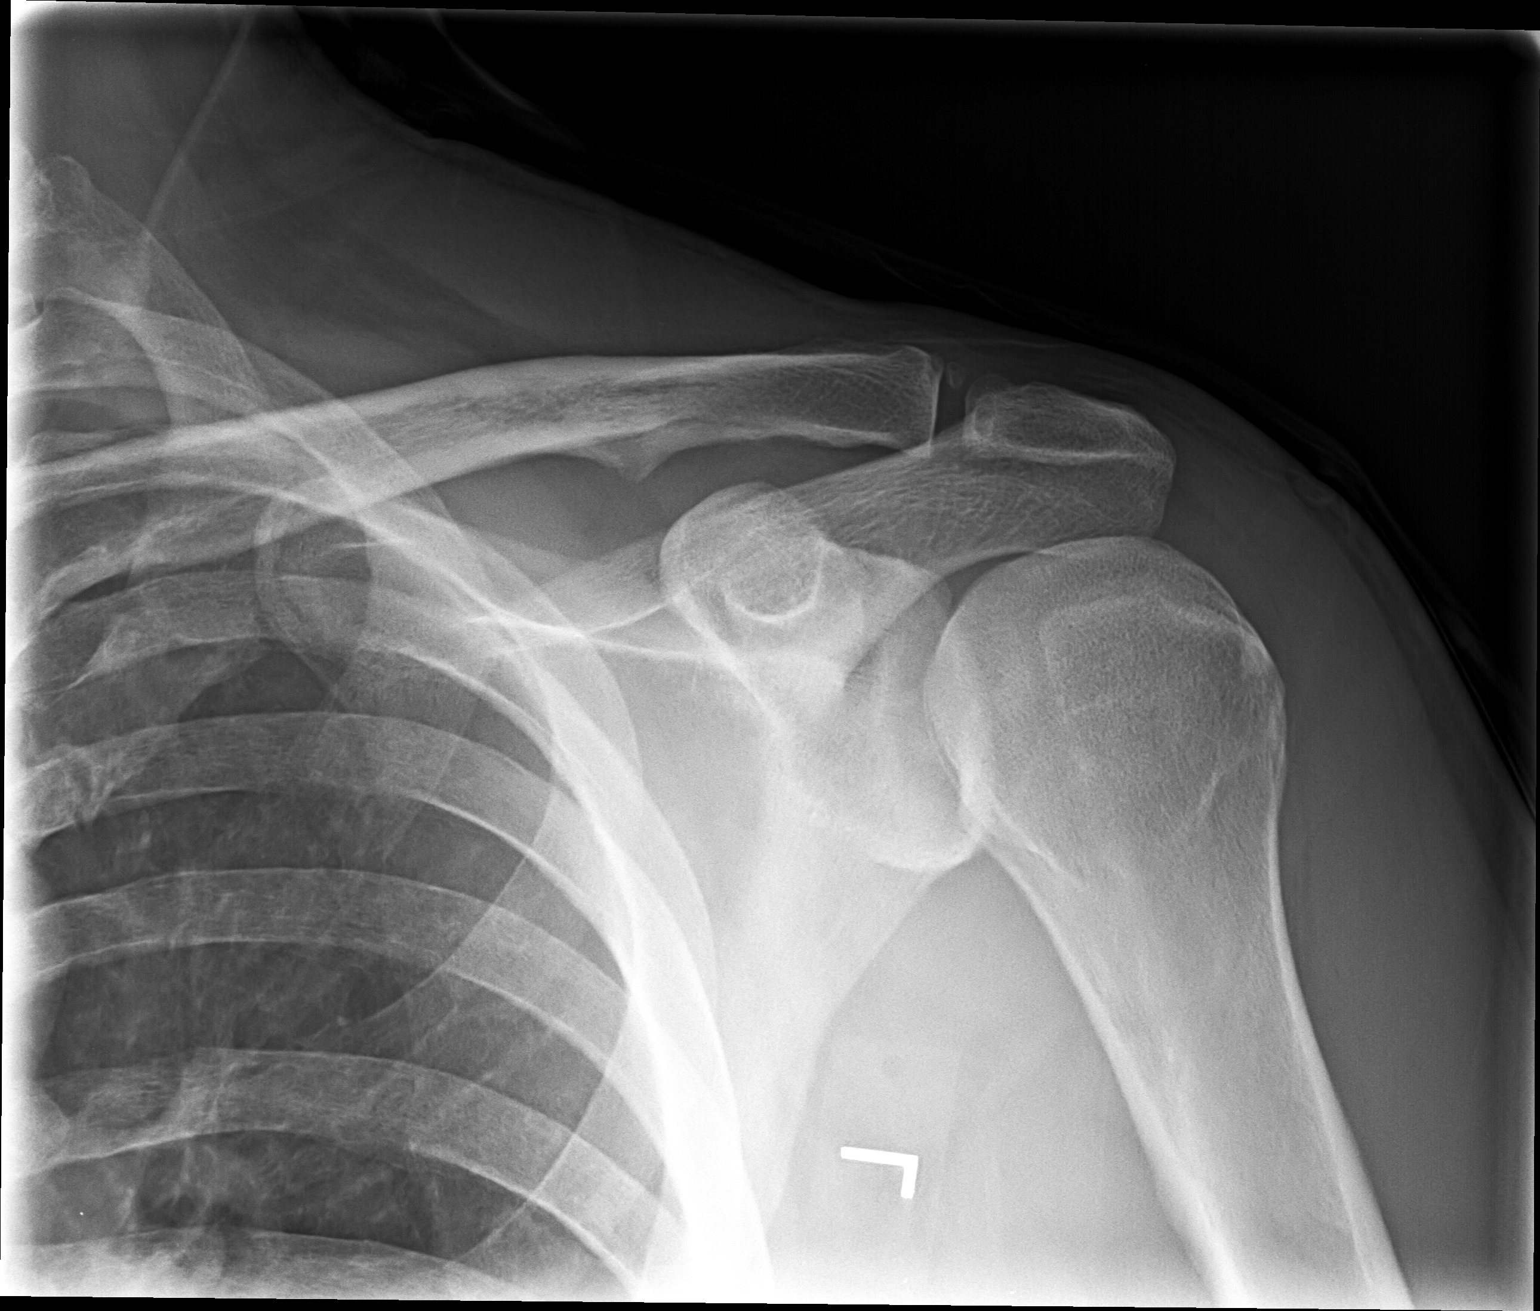

[view not recorded (3 of 3)]
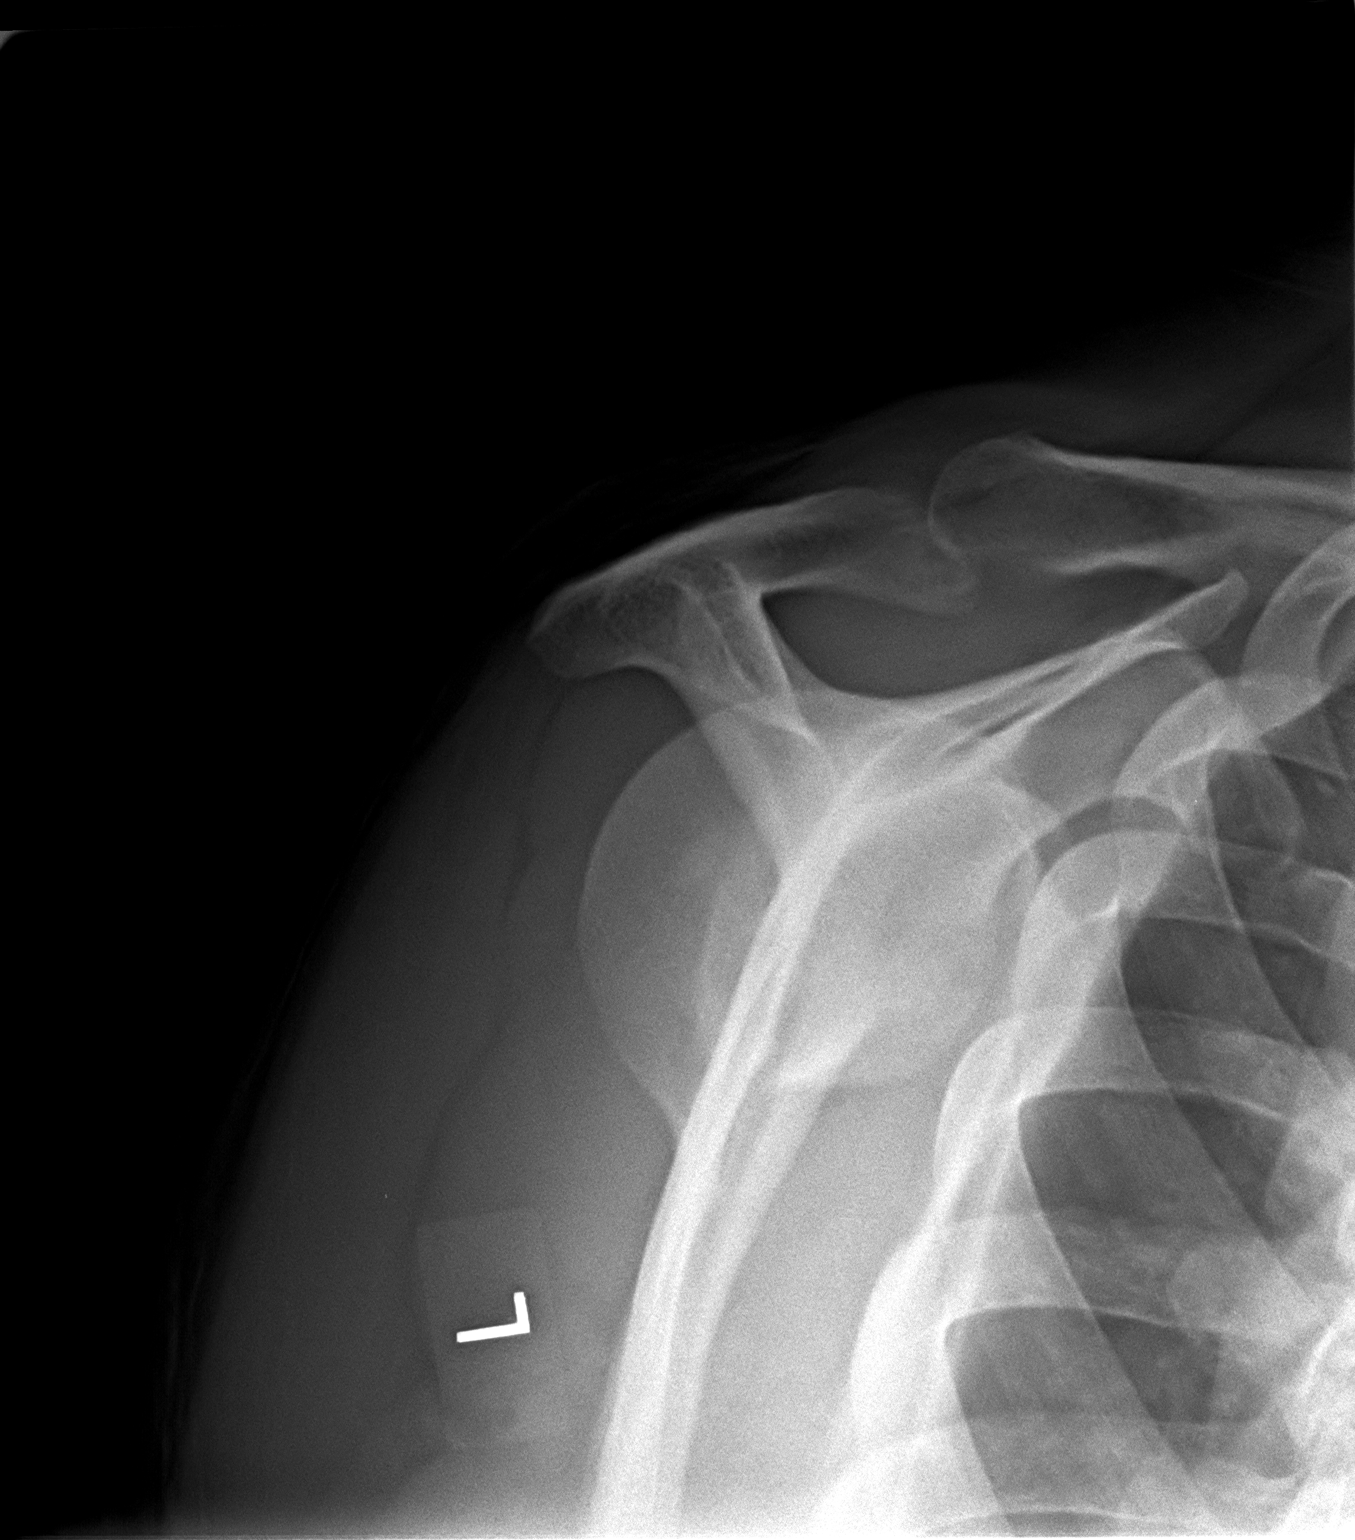

[3 of 3 positions shown; findings below may reference images not displayed]

FINDINGS: There is no evidence of fracture or dislocation. Visualized ribs
appear normal. There is no evidence of arthropathy or other focal
bone abnormality. Soft tissues are unremarkable.
IMPRESSION: Normal left shoulder.

## 2015-11-23 ENCOUNTER — Ambulatory Visit (INDEPENDENT_AMBULATORY_CARE_PROVIDER_SITE_OTHER): Payer: 59

## 2015-11-23 DIAGNOSIS — Z23 Encounter for immunization: Secondary | ICD-10-CM

## 2015-12-27 ENCOUNTER — Encounter: Payer: Self-pay | Admitting: Family Medicine

## 2015-12-27 ENCOUNTER — Ambulatory Visit (INDEPENDENT_AMBULATORY_CARE_PROVIDER_SITE_OTHER): Payer: 59 | Admitting: Family Medicine

## 2015-12-27 ENCOUNTER — Ambulatory Visit (INDEPENDENT_AMBULATORY_CARE_PROVIDER_SITE_OTHER): Payer: 59

## 2015-12-27 VITALS — BP 112/75 | HR 73 | Temp 97.7°F | Ht 74.0 in | Wt 216.6 lb

## 2015-12-27 DIAGNOSIS — R05 Cough: Secondary | ICD-10-CM

## 2015-12-27 DIAGNOSIS — R059 Cough, unspecified: Secondary | ICD-10-CM

## 2015-12-27 DIAGNOSIS — R079 Chest pain, unspecified: Secondary | ICD-10-CM

## 2015-12-27 DIAGNOSIS — R319 Hematuria, unspecified: Secondary | ICD-10-CM

## 2015-12-27 MED ORDER — PREDNISONE 10 MG PO TABS
ORAL_TABLET | ORAL | Status: DC
Start: 2015-12-27 — End: 2016-01-27

## 2015-12-27 MED ORDER — HYDROCODONE-HOMATROPINE 5-1.5 MG/5ML PO SYRP: 5.0000 mL | ORAL_SOLUTION | Freq: Three times a day (TID) | ORAL | Status: DC | PRN

## 2015-12-27 NOTE — Progress Notes (Signed)
   Subjective:    Patient ID: Derrick Wheeler, male    DOB: Dec 03, 1971, 45 y.o.   MRN: [id number]  HPI 45 year old gentleman here with cough. 10 days ago he was [place] in [place] in [place] and started on Augmentin, Flonase, and Mucinex. Sputum has cleared but he still has some persistent cough and left-sided chest pain. He denies sinus pain or pressure.  Patient Active Problem List   Diagnosis Date Noted  . Common migraine with intractable migraine 09/16/2015  . Vertigo 05/31/2015  . Chest pain 02/24/2015   Outpatient Encounter Prescriptions as of 12/27/2015  Medication Sig  . Multiple Vitamin (MULTIVITAMIN WITH MINERALS) TABS tablet Take 1 tablet by mouth daily.   . Omega-3 Fatty Acids (FISH OIL PO) Take 1 tablet by mouth every other day.  Marland Kitchen. omeprazole (PRILOSEC) 40 MG capsule Take 1 capsule (40 mg total) by mouth daily.  . divalproex (DEPAKOTE) 500 MG DR tablet One tablet daily for 1 week, then take 1 tablet twice a day (Patient not taking: Reported on 12/27/2015)  . nortriptyline (PAMELOR) 10 MG capsule Take one capsule at night for one week, then take 2 capsules at night for one week, then take 3 capsules at night (Patient not taking: Reported on 12/27/2015)  . rizatriptan (MAXALT-MLT) 10 MG disintegrating tablet Take 1 tablet (10 mg total) by mouth 3 (three) times daily as needed for migraine. May repeat in 2 hours if needed (Patient not taking: Reported on 12/27/2015)  . rosuvastatin (CRESTOR) 10 MG tablet Take 1 tablet (10 mg total) by mouth daily. (Patient not taking: Reported on 12/27/2015)   No facility-administered encounter medications on file as of 12/27/2015.      Review of Systems  Constitutional: Negative.   HENT: Negative.   Respiratory: Positive for cough.   Cardiovascular: Positive for chest pain.  Neurological: Negative.   Psychiatric/Behavioral: Negative.        Objective:   Physical Exam  Constitutional: He appears well-developed and well-nourished.  HENT:    Head: Normocephalic.  Mouth/Throat: Oropharynx is clear and moist.  Cardiovascular: Normal rate and regular rhythm.   Pulmonary/Chest: Effort normal and breath sounds normal.  Neurological: He is alert.          Assessment & Plan:  1. Cough I think he has some residual inflammation in his airways but is over the primary infection at this point I will give him some Hycodan to suppress the cough and prednisone beginning with 60 mg tapering over one week for inflammation in both his chest wall and bronchial system - DG Chest 2 View; Future  2. Chest pain, unspecified chest pain type X-ray is normal. I believe the chest pain as a result of the coughing he had experienced over the past 10 days  Frederica KusterStephen M Albirda Shiel MD

## 2016-01-17 ENCOUNTER — Ambulatory Visit: Payer: 59 | Admitting: Adult Health

## 2016-01-17 ENCOUNTER — Other Ambulatory Visit: Payer: Self-pay | Admitting: Family Medicine

## 2016-01-18 ENCOUNTER — Encounter: Payer: Self-pay | Admitting: Adult Health

## 2016-01-18 DIAGNOSIS — Z0289 Encounter for other administrative examinations: Secondary | ICD-10-CM

## 2016-01-27 ENCOUNTER — Ambulatory Visit (INDEPENDENT_AMBULATORY_CARE_PROVIDER_SITE_OTHER): Payer: 59 | Admitting: Adult Health

## 2016-01-27 ENCOUNTER — Encounter: Payer: Self-pay | Admitting: Adult Health

## 2016-01-27 ENCOUNTER — Telehealth: Payer: Self-pay | Admitting: Family Medicine

## 2016-01-27 VITALS — BP 120/88 | HR 88 | Resp 20 | Ht 74.0 in | Wt 220.0 lb

## 2016-01-27 DIAGNOSIS — G43019 Migraine without aura, intractable, without status migrainosus: Secondary | ICD-10-CM

## 2016-01-27 MED ORDER — DIVALPROEX SODIUM 500 MG PO DR TAB: DELAYED_RELEASE_TABLET | ORAL | Status: DC

## 2016-01-27 MED ORDER — RIZATRIPTAN BENZOATE 10 MG PO TBDP
10.0000 mg | ORAL_TABLET | Freq: Three times a day (TID) | ORAL | Status: DC | PRN
Start: 2016-01-27 — End: 2016-04-15

## 2016-01-27 NOTE — Patient Instructions (Signed)
Begin Depakote 1 table daily for 1 week then increase to 1 tablet twice a day May use Maxalt if needed for migraines If your symptoms worsen or you develop new symptoms please let us know.

## 2016-01-27 NOTE — Progress Notes (Signed)
I have read the note, and I agree with the clinical assessment and plan.  WILLIS,CHARLES KEITH   

## 2016-01-27 NOTE — Progress Notes (Signed)
PATIENT: Derrick Wheeler DOB: November 18, 1971  REASON FOR VISIT: follow up-  Vertigo, headaches HISTORY FROM: patient  HISTORY OF PRESENT ILLNESS:  Derrick Wheeler is a 45 year old male with a history of vertigo associated with headaches. He returns today for follow-up. Patient reports that he never started the Depakote because " his dog ate his medication." The patient also never started Maxalt. He states that he continues to have migraines. He states he has at least 2 a month however they last for several days. He states that he does have photophobia and phonophobia  With denies nausea and vomiting. He states that when he gets the headache he has to go lay in a dark room. Vertigo also occurs with his headaches. He also experiences some blurred vision during this time. He states that he feels like in November and December his headache frequency was worse due to having an upper respiratory infection. He returns today for an evaluation.  HISTORY 09/16/15:Derrick Wheeler is a 45 year old right-handed white male with a history of episodes of dizziness and vertigo associated with mild headaches. The patient may at times have more significant headaches that come up in the back of head spreading forward. The other headaches are dull, around the back of the eye. The patient may have nausea, but no vomiting with the severe headaches. He may experience photophobia and phonophobia. The patient is having some form of dizziness and headache 4 or 5 times a week. The patient has been placed on Topamax, but he could not tolerate the medication. He was switched nortriptyline, but never went higher than a 10 mg capsule at night. He has developed gastroesophageal reflux disease, and he has concerns that the nortriptyline may be worsening this. The patient is on B12 supplementation. He returns this office for an evaluation. MRI of the brain was done, this showed only a couple of tiny white matter lesions, fully consistent with the  history of migraine headache. He comes back for an evaluation  REVIEW OF SYSTEMS: Out of a complete 14 system review of symptoms, the patient complains only of the following symptoms, and all other reviewed systems are negative.   Joint pain, back pain, muscle cramps, neck stiffness, dizziness, headache, light sensitivity, blurred vision , chest tightness, shift work  ALLERGIES: Allergies  Allergen Reactions  . Meloxicam Shortness Of Breath and Rash  . Other     Bee sting  . Codeine Rash  . Sulfa Antibiotics Rash    HOME MEDICATIONS: Outpatient Prescriptions Prior to Visit  Medication Sig Dispense Refill  . Omega-3 Fatty Acids (FISH OIL PO) Take 1 tablet by mouth every other day.    Marland Kitchen omeprazole (PRILOSEC) 40 MG capsule TAKE 1 CAPSULE (40 MG TOTAL) BY MOUTH DAILY. 30 capsule 2  . divalproex (DEPAKOTE) 500 MG DR tablet One tablet daily for 1 week, then take 1 tablet twice a day (Patient not taking: Reported on 12/27/2015) 60 tablet 2  . HYDROcodone-homatropine (HYCODAN) 5-1.5 MG/5ML syrup Take 5 mLs by mouth every 8 (eight) hours as needed for cough. (Patient not taking: Reported on 01/27/2016) 120 mL 0  . Multiple Vitamin (MULTIVITAMIN WITH MINERALS) TABS tablet Take 1 tablet by mouth daily. Reported on 01/27/2016    . nortriptyline (PAMELOR) 10 MG capsule Take one capsule at night for one week, then take 2 capsules at night for one week, then take 3 capsules at night (Patient not taking: Reported on 12/27/2015) 90 capsule 3  . rizatriptan (MAXALT-MLT) 10 MG disintegrating tablet  Take 1 tablet (10 mg total) by mouth 3 (three) times daily as needed for migraine. May repeat in 2 hours if needed (Patient not taking: Reported on 12/27/2015) 9 tablet 3  . rosuvastatin (CRESTOR) 10 MG tablet Take 1 tablet (10 mg total) by mouth daily. (Patient not taking: Reported on 12/27/2015) 90 tablet 3  . predniSONE (DELTASONE) 10 MG tablet Take 6 tables first day then taper by one tablet each day 21 tablet 0   No  facility-administered medications prior to visit.    PAST MEDICAL HISTORY: Past Medical History  Diagnosis Date  . Scoliosis   . GERD (gastroesophageal reflux disease)   . Compression fracture of L1 lumbar vertebra (HCC)   . Cervical spine fracture (HCC)   . Hyperlipidemia   . Hypoglycemia   . Hiatal hernia   . Vertigo 05/31/2015  . Common migraine with intractable migraine 09/16/2015    PAST SURGICAL HISTORY: Past Surgical History  Procedure Laterality Date  . Removal of testicular mass    . Knee surgery    . Arthrscopic knee surgery x8    . Left wrist repair fracture x2    . Lumbar fracture repair x6    . Mouth surgery  08/2015    FAMILY HISTORY: Family History  Problem Relation Age of Onset  . Asthma Mother   . Arthritis Mother   . Stroke Mother   . Heart disease Father   . Colon cancer Neg Hx   . Esophageal cancer Neg Hx   . Rectal cancer Neg Hx   . Stomach cancer Neg Hx   . Cancer Maternal Grandfather     SOCIAL HISTORY: Social History   Social History  . Marital Status: Single    Spouse Name: N/A  . Number of Children: 1  . Years of Education: 14   Occupational History  . [employer]    Social History Main Topics  . Smoking status: Current Every Day Smoker -- 0.50 packs/day for 10 years    Types: Cigarettes    Last Attempt to Quit: 12/18/2014  . Smokeless tobacco: Never Used  . Alcohol Use: No     Comment: rare  . Drug Use: No  . Sexual Activity: Not on file   Other Topics Concern  . Not on file   Social History Narrative   Patient drinks caffeine occasionally.   Patient is right handed.         PHYSICAL EXAM  Filed Vitals:   01/27/16 1057  BP: 120/88  Pulse: 88  Resp: 20  Height:  (1.88 m)  Weight: 220 lb (99.791 kg)   Body mass index is 28.23 kg/(m^2).  Generalized: Well developed, in no acute distress   Neurological examination  Mentation: Alert oriented to time, place, history taking. Follows all commands speech  and language fluent Cranial nerve II-XII: Pupils were equal round reactive to light. Extraocular movements were full, visual field were full on confrontational test. Facial sensation and strength were normal. Uvula tongue midline. Head turning and shoulder shrug  were normal and symmetric. Motor: The motor testing reveals 5 over 5 strength of all 4 extremities. Good symmetric motor tone is noted throughout.  Limited mobility in the left shoulder due to a rotator cuff injury. Sensory: Sensory testing is intact to soft touch on all 4 extremities. No evidence of extinction is noted.  Coordination: Cerebellar testing reveals good finger-nose-finger and heel-to-shin bilaterally.  Gait and station: Gait is normal. Tandem gait is normal. Romberg is  negative. No drift is seen.  Reflexes: Deep tendon reflexes are symmetric and normal bilaterally.   DIAGNOSTIC DATA (LABS, IMAGING, TESTING) - I reviewed patient records, labs, notes, testing and imaging myself where available.  Lab Results  Component Value Date   WBC 6.5 07/21/2015   HGB 15.6 07/21/2015   HCT 51.1 07/21/2015   MCV 86.4 07/21/2015   PLT 254 02/16/2015      Component Value Date/Time   NA 142 07/21/2015 1818   NA 140 02/16/2015 2125   K 4.8 07/21/2015 1818   CL 100 07/21/2015 1818   CO2 25 07/21/2015 1818   GLUCOSE 83 07/21/2015 1818   GLUCOSE 102* 02/16/2015 2125   BUN 12 07/21/2015 1818   BUN 14 02/16/2015 2125   CREATININE 0.94 07/21/2015 1818   CALCIUM 9.6 07/21/2015 1818   PROT 7.2 02/04/2015 1128   ALBUMIN 4.5 02/04/2015 1128   AST 19 02/04/2015 1128   ALT 21 02/04/2015 1128   ALKPHOS 66 02/04/2015 1128   BILITOT 0.4 02/04/2015 1128   BILITOT 0.3 09/16/2014 1608   GFRNONAA 98 07/21/2015 1818   GFRAA 113 07/21/2015 1818   Lab Results  Component Value Date   CHOL 245* 02/04/2015   HDL 46 02/04/2015   LDLCALC 164* 02/19/2014   TRIG 126 02/04/2015       ASSESSMENT AND PLAN 45 y.o. year old male  has a past  medical history of Scoliosis; GERD (gastroesophageal reflux disease); Compression fracture of L1 lumbar vertebra (HCC); Cervical spine fracture (HCC); Hyperlipidemia; Hypoglycemia; Hiatal hernia; Vertigo (05/31/2015); and Common migraine with intractable migraine (09/16/2015). here with:   1. Migraine headaches  2. Vertigo   The patient will start on Depakote 500 mg one tablet daily for 1 week then increasing to one tablet twice a day thereafter. He will use Maxalt as needed for migraine treatment. Patient advised that if his headaches do not improve he should let us know. He will follow-up in 3-4 months or sooner if needed.   Butch Penny, MSN, NP-C 01/27/2016, 11:16 AM [facility] [address], [address] [place], [place] [address] [phone]

## 2016-02-07 ENCOUNTER — Encounter: Payer: Self-pay | Admitting: Family Medicine

## 2016-02-07 ENCOUNTER — Ambulatory Visit (INDEPENDENT_AMBULATORY_CARE_PROVIDER_SITE_OTHER): Payer: 59 | Admitting: Family Medicine

## 2016-02-07 VITALS — BP 109/80 | HR 121 | Temp 98.1°F | Ht 74.0 in | Wt 218.8 lb

## 2016-02-07 DIAGNOSIS — N200 Calculus of kidney: Secondary | ICD-10-CM | POA: Diagnosis not present

## 2016-02-07 DIAGNOSIS — R300 Dysuria: Secondary | ICD-10-CM | POA: Diagnosis not present

## 2016-02-07 LAB — POCT UA - MICROSCOPIC ONLY
BACTERIA, U MICROSCOPIC: NEGATIVE
CASTS, UR, LPF, POC: NEGATIVE
Crystals, Ur, HPF, POC: NEGATIVE
Epithelial cells, urine per micros: NEGATIVE
MUCUS UA: NEGATIVE
WBC, Ur, HPF, POC: NEGATIVE
Yeast, UA: NEGATIVE

## 2016-02-07 LAB — POCT URINALYSIS DIPSTICK

## 2016-02-07 MED ORDER — CEPHALEXIN 500 MG PO CAPS: 500.0000 mg | ORAL_CAPSULE | Freq: Four times a day (QID) | ORAL | Status: DC

## 2016-02-07 MED ORDER — TAMSULOSIN HCL 0.4 MG PO CAPS: 0.4000 mg | ORAL_CAPSULE | Freq: Every day | ORAL | Status: DC

## 2016-02-07 NOTE — Progress Notes (Signed)
BP 109/80 mmHg  Pulse 121  Temp(Src) 98.1 F (36.7 C) (Oral)  Ht  (1.88 m)  Wt 218 lb 12.8 oz (99.247 kg)  BMI 28.08 kg/m2   Subjective:    Patient ID: Derrick Wheeler, male    DOB: 1971-07-28, 45 y.o.   MRN: [id number]  HPI: Derrick Wheeler is a 45 y.o. male presenting on 02/07/2016 for Urinary Tract Infection and Back Pain   HPI Dysuria and back pain Patient has had 3 days worth of dysuria and back pain which is bilateral flank and dark colored urine. He denies any fevers or chills. He does admit that the back pain could be caused from his dog dragon on the stairs about 3 weeks ago. He has had a out of kidney stones once before in his life. He has been trying to hydrate with lots of water over the past couple days to try and flush out any possible kidney stones but does admit that prior to that he does drink mostly cola drinks.  Relevant past medical, surgical, family and social history reviewed and updated as indicated. Interim medical history since our last visit reviewed. Allergies and medications reviewed and updated.  Review of Systems  Constitutional: Negative for fever and chills.  HENT: Negative for ear discharge and ear pain.   Eyes: Negative for discharge and visual disturbance.  Respiratory: Negative for shortness of breath and wheezing.   Cardiovascular: Negative for chest pain and leg swelling.  Gastrointestinal: Negative for abdominal pain, diarrhea, constipation and abdominal distention.  Genitourinary: Positive for dysuria, urgency, frequency, hematuria and flank pain. Negative for decreased urine volume and difficulty urinating.  Musculoskeletal: Negative for back pain and gait problem.  Skin: Negative for rash.  Neurological: Negative for syncope, light-headedness and headaches.  All other systems reviewed and are negative.   Per HPI unless specifically indicated above     Medication List       This list is accurate as of: 02/07/16 10:44 AM.   Always use your most recent med list.               cephALEXin 500 MG capsule  Commonly known as:  KEFLEX  Take 1 capsule (500 mg total) by mouth 4 (four) times daily.     divalproex 500 MG DR tablet  Commonly known as:  DEPAKOTE  One tablet daily for 1 week, then take 1 tablet twice a day     FISH OIL PO  Take 1 tablet by mouth every other day. Reported on 02/07/2016     multivitamin with minerals Tabs tablet  Take 1 tablet by mouth daily. Reported on 01/27/2016     omeprazole 40 MG capsule  Commonly known as:  PRILOSEC  TAKE 1 CAPSULE (40 MG TOTAL) BY MOUTH DAILY.     rizatriptan 10 MG disintegrating tablet  Commonly known as:  MAXALT-MLT  Take 1 tablet (10 mg total) by mouth 3 (three) times daily as needed for migraine. May repeat in 2 hours if needed     rosuvastatin 10 MG tablet  Commonly known as:  CRESTOR  Take 1 tablet (10 mg total) by mouth daily.     tamsulosin 0.4 MG Caps capsule  Commonly known as:  FLOMAX  Take 1 capsule (0.4 mg total) by mouth daily.           Objective:    BP 109/80 mmHg  Pulse 121  Temp(Src) 98.1 F (36.7 C) (Oral)  Ht  (1.88  m)  Wt 218 lb 12.8 oz (99.247 kg)  BMI 28.08 kg/m2  Wt Readings from Last 3 Encounters:  02/07/16 218 lb 12.8 oz (99.247 kg)  01/27/16 220 lb (99.791 kg)  12/27/15 216 lb 9.6 oz (98.249 kg)    Physical Exam  Constitutional: He is oriented to person, place, and time. He appears well-developed and well-nourished. No distress.  Eyes: Conjunctivae and EOM are normal. Pupils are equal, round, and reactive to light. Right eye exhibits no discharge. No scleral icterus.  Cardiovascular: Normal rate, regular rhythm, normal heart sounds and intact distal pulses.   No murmur heard. Pulmonary/Chest: Effort normal and breath sounds normal. No respiratory distress. He has no wheezes.  Abdominal: Soft. Bowel sounds are normal. He exhibits no distension. There is no hepatosplenomegaly. There is no tenderness. There  is no CVA tenderness (patient has lumbar back pain but not CVA tenderness).  Musculoskeletal: Normal range of motion. He exhibits no edema.  Neurological: He is alert and oriented to person, place, and time. Coordination normal.  Skin: Skin is warm and dry. No rash noted. He is not diaphoretic.  Psychiatric: He has a normal mood and affect. His behavior is normal.  Vitals reviewed.   Results for orders placed or performed in visit on 02/07/16  POCT UA - Microscopic Only  Result Value Ref Range   WBC, Ur, HPF, POC NEG    RBC, urine, microscopic TNTC    Bacteria, U Microscopic NEG    Mucus, UA NEG    Epithelial cells, urine per micros NEG    Crystals, Ur, HPF, POC NEG    Casts, Ur, LPF, POC NEG    Yeast, UA NEG   POCT urinalysis dipstick  Result Value Ref Range   Color, UA turbid    Clarity, UA     Glucose, UA     Bilirubin, UA     Ketones, UA     Spec Grav, UA     Blood, UA     pH, UA     Protein, UA     Urobilinogen, UA     Nitrite, UA     Leukocytes, UA  Negative      Assessment & Plan:   Problem List Items Addressed This Visit    None    Visit Diagnoses    Dysuria    -  Primary    Relevant Medications    tamsulosin (FLOMAX) 0.4 MG CAPS capsule    cephALEXin (KEFLEX) 500 MG capsule    Other Relevant Orders    POCT UA - Microscopic Only (Completed)    POCT urinalysis dipstick (Completed)    Urine culture    Renal stones        Relevant Medications    tamsulosin (FLOMAX) 0.4 MG CAPS capsule        Follow up plan: Return if symptoms worsen or fail to improve.  Counseling provided for all of the vaccine components Orders Placed This Encounter  Procedures  . Urine culture  . POCT UA - Microscopic Only  . POCT urinalysis dipstick    Arville Care, MD [facility] 02/07/2016, 10:44 AM

## 2016-02-08 ENCOUNTER — Telehealth: Payer: Self-pay | Admitting: Family Medicine

## 2016-02-08 NOTE — Telephone Encounter (Signed)
I spoke with patient and discussed his results with him.

## 2016-02-09 ENCOUNTER — Encounter: Payer: Self-pay | Admitting: Family Medicine

## 2016-02-09 ENCOUNTER — Telehealth: Payer: Self-pay | Admitting: Family Medicine

## 2016-02-09 ENCOUNTER — Ambulatory Visit (HOSPITAL_COMMUNITY)
Admission: RE | Admit: 2016-02-09 | Discharge: 2016-02-09 | Disposition: A | Discharge status: Home / Self Care | Payer: 59 | Source: Ambulatory Visit | Attending: Family Medicine | Admitting: Family Medicine

## 2016-02-09 ENCOUNTER — Ambulatory Visit (INDEPENDENT_AMBULATORY_CARE_PROVIDER_SITE_OTHER): Payer: 59 | Admitting: Family Medicine

## 2016-02-09 VITALS — BP 110/78 | HR 95 | Temp 97.3°F

## 2016-02-09 DIAGNOSIS — N200 Calculus of kidney: Secondary | ICD-10-CM

## 2016-02-09 DIAGNOSIS — R52 Pain, unspecified: Secondary | ICD-10-CM | POA: Diagnosis present

## 2016-02-09 LAB — URINE CULTURE

## 2016-02-09 MED ORDER — NITROFURANTOIN MONOHYD MACRO 100 MG PO CAPS: 100.0000 mg | ORAL_CAPSULE | Freq: Two times a day (BID) | ORAL | Status: DC

## 2016-02-09 MED ORDER — HYDROCODONE-ACETAMINOPHEN 5-325 MG PO TABS: 1.0000 | ORAL_TABLET | Freq: Four times a day (QID) | ORAL | Status: DC | PRN

## 2016-02-09 NOTE — Progress Notes (Signed)
BP 110/78 mmHg  Pulse 95  Temp(Src) 97.3 F (36.3 C) (Oral)  SpO2 98%   Subjective:    Patient ID: Derrick Wheeler, male    DOB: 13-Jun-1971, 45 y.o.   MRN: [id number]  HPI: Derrick Wheeler is a 45 y.o. male presenting on 02/09/2016 for Rash all over body and Back pain and blood in urine   HPI Hematuria back pain Patient has had hematuria and flank pain worse on the left than right. He thought he was getting better and then he started having more pain and blood in his urine just yesterday night and today. He denies any fevers or chills. His urine culture has not grown anything back yet. He thought she may have had a reaction to Keflex but he has previously tolerated Rocephin many times before. He does have a fine rash on his arms and torso and legs sparing face feet and hands.he denies any issues with breathing. His pain has come back to 9 out of 10.  Relevant past medical, surgical, family and social history reviewed and updated as indicated. Interim medical history since our last visit reviewed. Allergies and medications reviewed and updated.  Review of Systems  Constitutional: Negative for fever and chills.  HENT: Negative for ear discharge and ear pain.   Eyes: Negative for discharge and visual disturbance.  Respiratory: Negative for shortness of breath and wheezing.   Cardiovascular: Negative for chest pain and leg swelling.  Gastrointestinal: Negative for abdominal pain, diarrhea, constipation and abdominal distention.  Genitourinary: Positive for dysuria, urgency, frequency, hematuria and flank pain. Negative for decreased urine volume and difficulty urinating.  Musculoskeletal: Negative for back pain and gait problem.  Skin: Negative for rash.  Neurological: Negative for syncope, light-headedness and headaches.  All other systems reviewed and are negative.   Per HPI unless specifically indicated above     Medication List       This list is accurate as of: 02/09/16   9:15 AM.  Always use your most recent med list.               cephALEXin 500 MG capsule  Commonly known as:  KEFLEX  Take 1 capsule (500 mg total) by mouth 4 (four) times daily.     divalproex 500 MG DR tablet  Commonly known as:  DEPAKOTE  One tablet daily for 1 week, then take 1 tablet twice a day     FISH OIL PO  Take 1 tablet by mouth every other day. Reported on 02/07/2016     HYDROcodone-acetaminophen 5-325 MG tablet  Commonly known as:  NORCO  Take 1 tablet by mouth every 6 (six) hours as needed for moderate pain.     multivitamin with minerals Tabs tablet  Take 1 tablet by mouth daily. Reported on 01/27/2016     nitrofurantoin (macrocrystal-monohydrate) 100 MG capsule  Commonly known as:  MACROBID  Take 1 capsule (100 mg total) by mouth 2 (two) times daily. 1 po BId     omeprazole 40 MG capsule  Commonly known as:  PRILOSEC  TAKE 1 CAPSULE (40 MG TOTAL) BY MOUTH DAILY.     rizatriptan 10 MG disintegrating tablet  Commonly known as:  MAXALT-MLT  Take 1 tablet (10 mg total) by mouth 3 (three) times daily as needed for migraine. May repeat in 2 hours if needed     rosuvastatin 10 MG tablet  Commonly known as:  CRESTOR  Take 1 tablet (10 mg total) by mouth daily.  tamsulosin 0.4 MG Caps capsule  Commonly known as:  FLOMAX  Take 1 capsule (0.4 mg total) by mouth daily.           Objective:    BP 110/78 mmHg  Pulse 95  Temp(Src) 97.3 F (36.3 C) (Oral)  SpO2 98%  Wt Readings from Last 3 Encounters:  02/07/16 218 lb 12.8 oz (99.247 kg)  01/27/16 220 lb (99.791 kg)  12/27/15 216 lb 9.6 oz (98.249 kg)    Physical Exam  Constitutional: He is oriented to person, place, and time. He appears well-developed and well-nourished. No distress.  Eyes: Conjunctivae and EOM are normal. Pupils are equal, round, and reactive to light. Right eye exhibits no discharge. No scleral icterus.  Cardiovascular: Normal rate, regular rhythm, normal heart sounds and intact distal  pulses.   No murmur heard. Pulmonary/Chest: Effort normal and breath sounds normal. No respiratory distress. He has no wheezes.  Abdominal: Soft. Bowel sounds are normal. He exhibits no distension. There is no hepatosplenomegaly. There is no tenderness. There is no CVA tenderness (patient has lumbar back pain but not CVA tenderness).  Musculoskeletal: Normal range of motion. He exhibits no edema.  Neurological: He is alert and oriented to person, place, and time. Coordination normal.  Skin: Skin is warm and dry. No rash noted. He is not diaphoretic.  Psychiatric: He has a normal mood and affect. His behavior is normal.  Vitals reviewed.  Results for orders placed or performed in visit on 02/07/16  Urine culture  Result Value Ref Range   Urine Culture, Routine Final report    Urine Culture result 1 Comment   POCT UA - Microscopic Only  Result Value Ref Range   WBC, Ur, HPF, POC NEG    RBC, urine, microscopic TNTC    Bacteria, U Microscopic NEG    Mucus, UA NEG    Epithelial cells, urine per micros NEG    Crystals, Ur, HPF, POC NEG    Casts, Ur, LPF, POC NEG    Yeast, UA NEG   POCT urinalysis dipstick  Result Value Ref Range   Color, UA turbid    Clarity, UA     Glucose, UA     Bilirubin, UA     Ketones, UA     Spec Grav, UA     Blood, UA     pH, UA     Protein, UA     Urobilinogen, UA     Nitrite, UA     Leukocytes, UA  Negative      Assessment & Plan:   Problem List Items Addressed This Visit    None    Visit Diagnoses    Renal stones    -  Primary     patient may or may not have had a reaction to Keflex has tolerated Rocephin before. Also started Flomax same time so there is a possibility it reacted as well.    Relevant Medications    nitrofurantoin, macrocrystal-monohydrate, (MACROBID) 100 MG capsule    HYDROcodone-acetaminophen (NORCO) 5-325 MG tablet    Other Relevant Orders    CT RENAL STONE STUDY       Follow up plan: Return if symptoms worsen or fail to  improve.  Counseling provided for all of the vaccine components Orders Placed This Encounter  Procedures  . CT RENAL STONE STUDY    Arville Care, MD [facility] 02/09/2016, 9:15 AM

## 2016-02-14 ENCOUNTER — Other Ambulatory Visit: Payer: Self-pay | Admitting: Family Medicine

## 2016-02-14 NOTE — Telephone Encounter (Signed)
Last seen 02/09/16 Dr Dettinger   PCP  [facility]  Last lipid 02/02/15

## 2016-04-15 ENCOUNTER — Ambulatory Visit (INDEPENDENT_AMBULATORY_CARE_PROVIDER_SITE_OTHER): Payer: 59 | Admitting: Family Medicine

## 2016-04-15 ENCOUNTER — Encounter: Payer: Self-pay | Admitting: Family Medicine

## 2016-04-15 VITALS — BP 109/74 | HR 86 | Temp 97.0°F | Ht 74.0 in | Wt 217.0 lb

## 2016-04-15 DIAGNOSIS — J204 Acute bronchitis due to parainfluenza virus: Secondary | ICD-10-CM

## 2016-04-15 DIAGNOSIS — R42 Dizziness and giddiness: Secondary | ICD-10-CM | POA: Diagnosis not present

## 2016-04-15 MED ORDER — BETAMETHASONE SOD PHOS & ACET 6 (3-3) MG/ML IJ SUSP: 6.0000 mg | Freq: Once | INTRAMUSCULAR | Status: DC

## 2016-04-15 MED ORDER — MOXIFLOXACIN HCL 400 MG PO TABS: 400.0000 mg | ORAL_TABLET | Freq: Every day | ORAL | Status: DC

## 2016-04-15 NOTE — Addendum Note (Signed)
Addended by: Mechele ClaudeSTACKS, Khyran Riera on: 04/15/2016 11:36 AM   Modules accepted: Orders

## 2016-04-15 NOTE — Progress Notes (Signed)
Subjective:  Patient ID: Derrick Wheeler, male    DOB: 25-Feb-1971  Age: 45 y.o. MRN: [id number]  CC: Dizziness and Shortness of Breath   HPI KALDEN WANKE presents for 4 days of lassitude. Staying in bed. Mild cough. More of a chest tightness and funny feeling. Eyes burn. Throat sore.Dizzy is poor balance, equilibrium.   Hx of migraine without the headache per neurology. Multiple trauma in [employer]. Had neck fx, lumbar fx & multple surgeries.   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   He has past surgical history that includes Removal of testicular mass; Knee surgery; arthrscopic knee surgery x8; left wrist repair fracture x2; lumbar fracture repair x6; and Mouth surgery (08/2015).   His family history includes Arthritis in his mother; Asthma in his mother; Cancer in his maternal grandfather; Heart disease in his father; Stroke in his mother. There is no history of Colon cancer, Esophageal cancer, Rectal cancer, or Stomach cancer.He reports that he has been smoking Cigarettes.  He has a 5 pack-year smoking history. He has never used smokeless tobacco. He reports that he does not drink alcohol or use illicit drugs.    ROS Review of Systems  Constitutional: Positive for chills, diaphoresis, activity change and fatigue. Negative for fever and appetite change.  HENT: Positive for congestion. Negative for rhinorrhea and sore throat.   Eyes: Positive for pain. Negative for visual disturbance.  Respiratory: Positive for cough (dry), chest tightness and shortness of breath.   Cardiovascular: Negative for chest pain.  Gastrointestinal: Negative for abdominal pain.  Musculoskeletal: Negative for myalgias and arthralgias.    Skin: Negative for rash.  Neurological: Positive for dizziness, weakness and light-headedness. Negative for tremors, syncope and headaches.    Objective:  BP 109/74 mmHg  Pulse 86  Temp(Src) 97 F (36.1 C) (Oral)  Ht 6' 2" (1.88 m)  Wt 217 lb (98.431 kg)  BMI 27.85 kg/m2  SpO2 98%  BP Readings from Last 3 Encounters:  04/15/16 109/74  02/09/16 110/78  02/07/16 109/80    Wt Readings from Last 3 Encounters:  04/15/16 217 lb (98.431 kg)  02/07/16 218 lb 12.8 oz (99.247 kg)  01/27/16 220 lb (99.791 kg)     Physical Exam  Constitutional: He is oriented to person, place, and time. He appears well-developed and well-nourished. No distress.  HENT:  Head: Normocephalic and atraumatic.  Right Ear: External ear normal.  Left Ear: External ear normal.  Nose: Nose normal.  Mouth/Throat: Oropharynx is clear and moist.  Eyes: Conjunctivae and EOM are normal. Pupils are equal, round, and reactive to light.  Neck: Normal range of motion. Neck supple. No thyromegaly present.  Cardiovascular: Normal rate, regular rhythm and normal heart sounds.   No murmur heard. Pulmonary/Chest: Effort normal. No respiratory distress. He has wheezes. He has no rales.  Abdominal: Soft. Bowel sounds are normal. He exhibits no distension. There is no tenderness.  Lymphadenopathy:    He has no cervical adenopathy.  Neurological: He is alert and oriented to person, place, and time. He has normal reflexes.  Skin: Skin is warm and dry.  Psychiatric: He has a normal mood and affect. His behavior is normal. Judgment and thought content normal.     Lab Results  Component Value Date   WBC 6.5 07/21/2015   HGB  15.6 07/21/2015   HCT 51.1 07/21/2015   PLT 254 02/16/2015   GLUCOSE 83 07/21/2015   CHOL 245* 02/04/2015   TRIG 126 02/04/2015   HDL 46 02/04/2015   LDLCALC 164* 02/19/2014   ALT 21 02/04/2015   AST 19 02/04/2015   NA 142 07/21/2015   K 4.8 07/21/2015   CL 100 07/21/2015   CREATININE 0.94  07/21/2015   BUN 12 07/21/2015   CO2 25 07/21/2015   TSH 1.230 05/18/2015   HGBA1C 5.2% 09/16/2014    Ct Renal Stone Study  02/09/2016  CLINICAL DATA:  45 year old male with right side abdominal pain for 1 day with microscopic hematuria. Initial encounter. Fell 3 weeks ago. EXAM: CT ABDOMEN AND PELVIS WITHOUT CONTRAST TECHNIQUE: Multidetector CT imaging of the abdomen and pelvis was performed following the standard protocol without IV contrast. COMPARISON:  Chest CTA 02/17/2015 FINDINGS: Minor atelectasis or scarring at the left lung base. No pericardial or pleural effusion. Degenerative changes in the spine. No acute osseous abnormality identified. No pelvic free fluid. Negative distal colon aside from mild diverticulosis and some retained stool. Decompressed left colon and transverse colon. Negative right colon and appendix (image 53 series 2). Negative terminal ileum. No dilated small bowel. Negative stomach and duodenum. No abdominal free air or free fluid. Negative noncontrast liver, gallbladder, spleen, pancreas and adrenal glands. Negative non contrast right kidney and right ureter. Diminutive urinary bladder. Negative noncontrast left kidney and left ureter. Multiple pelvic phleboliths. No calcified aortic atherosclerosis. No lymphadenopathy. No inflammatory stranding identified in the abdomen or pelvis. IMPRESSION: No urologic calculus or evidence of obstructive uropathy. Normal appendix. Negative noncontrast CT Abdomen and Pelvis Electronically Signed   By: Genevie Ann M.D.   On: 02/09/2016 11:22    Assessment & Plan:   Tyjon was seen today for dizziness and shortness of breath.  Diagnoses and all orders for this visit:  Dizziness -     CBC with Differential/Platelet -     CMP14+EGFR -     Sedimentation rate -     betamethasone acetate-betamethasone sodium phosphate (CELESTONE) injection 6 mg; Inject 1 mL (6 mg total) into the muscle once.  Parainfluenza virus bronchitis -      betamethasone acetate-betamethasone sodium phosphate (CELESTONE) injection 6 mg; Inject 1 mL (6 mg total) into the muscle once.  Other orders -     moxifloxacin (AVELOX) 400 MG tablet; Take 1 tablet (400 mg total) by mouth daily.  Follow up with neurology  I have discontinued Mr. Dejaynes rizatriptan, tamsulosin, cephALEXin, nitrofurantoin (macrocrystal-monohydrate), HYDROcodone-acetaminophen, and rosuvastatin. I am also having him start on moxifloxacin. Additionally, I am having him maintain his multivitamin with minerals, Omega-3 Fatty Acids (FISH OIL PO), omeprazole, and divalproex. We will continue to administer betamethasone acetate-betamethasone sodium phosphate.  Meds ordered this encounter  Medications  . betamethasone acetate-betamethasone sodium phosphate (CELESTONE) injection 6 mg    Sig:   . moxifloxacin (AVELOX) 400 MG tablet    Sig: Take 1 tablet (400 mg total) by mouth daily.    Dispense:  7 tablet    Refill:  0     Follow-up: Return if symptoms worsen or fail to improve.  Claretta Fraise, M.D.

## 2016-04-16 LAB — CBC WITH DIFFERENTIAL/PLATELET
BASOS ABS: 0.1 10*3/uL (ref 0.0–0.2)
BASOS: 1 %
EOS (ABSOLUTE): 0.3 10*3/uL (ref 0.0–0.4)
Eos: 4 %
HEMOGLOBIN: 16.1 g/dL (ref 12.6–17.7)
Hematocrit: 49.6 % (ref 37.5–51.0)
IMMATURE GRANS (ABS): 0 10*3/uL (ref 0.0–0.1)
IMMATURE GRANULOCYTES: 0 %
LYMPHS: 28 %
Lymphocytes Absolute: 2.2 10*3/uL (ref 0.7–3.1)
MCH: 28.6 pg (ref 26.6–33.0)
MCHC: 32.5 g/dL (ref 31.5–35.7)
MCV: 88 fL (ref 79–97)
MONOCYTES: 11 %
Monocytes Absolute: 0.9 10*3/uL (ref 0.1–0.9)
NEUTROS ABS: 4.4 10*3/uL (ref 1.4–7.0)
NEUTROS PCT: 56 %
Platelets: 255 10*3/uL (ref 150–379)
RBC: 5.63 x10E6/uL (ref 4.14–5.80)
RDW: 14 % (ref 12.3–15.4)
WBC: 7.8 10*3/uL (ref 3.4–10.8)

## 2016-04-16 LAB — CMP14+EGFR
ALT: 17 IU/L (ref 0–44)
AST: 18 IU/L (ref 0–40)
Albumin/Globulin Ratio: 1.5 (ref 1.2–2.2)
Albumin: 4.4 g/dL (ref 3.5–5.5)
Alkaline Phosphatase: 72 IU/L (ref 39–117)
BUN/Creatinine Ratio: 8 — ABNORMAL LOW (ref 9–20)
BUN: 10 mg/dL (ref 6–24)
Bilirubin Total: 0.6 mg/dL (ref 0.0–1.2)
CALCIUM: 9.8 mg/dL (ref 8.7–10.2)
CO2: 23 mmol/L (ref 18–29)
Chloride: 98 mmol/L (ref 96–106)
Creatinine, Ser: 1.26 mg/dL (ref 0.76–1.27)
GFR, EST AFRICAN AMERICAN: 79 mL/min/{1.73_m2} (ref >59)
GFR, EST NON AFRICAN AMERICAN: 68 mL/min/{1.73_m2} (ref >59)
GLUCOSE: 90 mg/dL (ref 65–99)
Globulin, Total: 2.9 g/dL (ref 1.5–4.5)
POTASSIUM: 4.1 mmol/L (ref 3.5–5.2)
Sodium: 139 mmol/L (ref 134–144)
TOTAL PROTEIN: 7.3 g/dL (ref 6.0–8.5)

## 2016-04-16 LAB — SEDIMENTATION RATE: SED RATE: 2 mm/h (ref 0–15)

## 2016-05-29 ENCOUNTER — Encounter: Payer: Self-pay | Admitting: Adult Health

## 2016-05-29 ENCOUNTER — Telehealth: Payer: Self-pay | Admitting: *Deleted

## 2016-05-29 ENCOUNTER — Ambulatory Visit (INDEPENDENT_AMBULATORY_CARE_PROVIDER_SITE_OTHER): Payer: 59 | Admitting: Adult Health

## 2016-05-29 VITALS — BP 118/76 | HR 78 | Ht 74.0 in | Wt 213.8 lb

## 2016-05-29 DIAGNOSIS — H8110 Benign paroxysmal vertigo, unspecified ear: Secondary | ICD-10-CM

## 2016-05-29 DIAGNOSIS — G43009 Migraine without aura, not intractable, without status migrainosus: Secondary | ICD-10-CM

## 2016-05-29 MED ORDER — DIVALPROEX SODIUM 500 MG PO DR TAB: DELAYED_RELEASE_TABLET | ORAL | Status: DC

## 2016-05-29 NOTE — Telephone Encounter (Signed)
Enc opened in error

## 2016-05-29 NOTE — Patient Instructions (Signed)
Take Depakote daily Vestibular rehab for vertigo If your symptoms worsen or you develop new symptoms please let us know.

## 2016-05-29 NOTE — Progress Notes (Addendum)
PATIENT: Derrick Wheeler DOB: 19-Mar-1971  REASON FOR VISIT: follow up- vertigo, headaches HISTORY FROM: patient  HISTORY OF PRESENT ILLNESS: Derrick Wheeler is a 45 year old male with a history of vertigo and headaches. He returns today for follow-up. The patient states that he continues to have frequent headaches. He states that he has dull headaches frequently and may have a full migraine once a month. He does report light sensitivity with his headaches. He states that he also has vertigo. He states if he looks up or down the room will start spinning. Typically if he stops what he is doing the dizziness will resolve. He does have blurred vision and nausea associated with the vertigo. The patient was given a prescription of Depakote however he states he is only taking this if needed. The patient does state that the vertigo episodes does not always correlate with his headaches. He returns today for an evaluation.  HISTORY 01/27/16 (MM): Derrick Wheeler is a 45 year old male with a history of vertigo associated with headaches. He returns today for follow-up. Patient reports that he never started the Depakote because " his dog ate his medication." The patient also never started Maxalt. He states that he continues to have migraines. He states he has at least 2 a month however they last for several days. He states that he does have photophobia and phonophobia With denies nausea and vomiting. He states that when he gets the headache he has to go lay in a dark room. Vertigo also occurs with his headaches. He also experiences some blurred vision during this time. He states that he feels like in November and December his headache frequency was worse due to having an upper respiratory infection. He returns today for an evaluation.  HISTORY 09/16/15:Derrick Wheeler is a 45 year old right-handed white male with a history of episodes of dizziness and vertigo associated with mild headaches. The patient may at times have more  significant headaches that come up in the back of head spreading forward. The other headaches are dull, around the back of the eye. The patient may have nausea, but no vomiting with the severe headaches. He may experience photophobia and phonophobia. The patient is having some form of dizziness and headache 4 or 5 times a week. The patient has been placed on Topamax, but he could not tolerate the medication. He was switched nortriptyline, but never went higher than a 10 mg capsule at night. He has developed gastroesophageal reflux disease, and he has concerns that the nortriptyline may be worsening this. The patient is on B12 supplementation. He returns this office for an evaluation. MRI of the brain was done, this showed only a couple of tiny white matter lesions, fully consistent with the history of migraine headache. He comes back for an evaluation  REVIEW OF SYSTEMS: Out of a complete 14 system review of symptoms, the patient complains only of the following symptoms, and all other reviewed systems are negative.  Blurred vision, dizziness, headache  ALLERGIES: Allergies  Allergen Reactions  . Meloxicam Shortness Of Breath and Rash  . Other     Bee sting  . Codeine Rash  . Keflex [Cephalexin] Rash  . Sulfa Antibiotics Rash  . Sulfacetamide Sodium Rash    HOME MEDICATIONS: Outpatient Prescriptions Prior to Visit  Medication Sig Dispense Refill  . Multiple Vitamin (MULTIVITAMIN WITH MINERALS) TABS tablet Take 1 tablet by mouth daily. Reported on 01/27/2016    . Omega-3 Fatty Acids (FISH OIL PO) Take 1 tablet by mouth  every other day. Reported on 02/07/2016    . omeprazole (PRILOSEC) 40 MG capsule TAKE 1 CAPSULE (40 MG TOTAL) BY MOUTH DAILY. 30 capsule 2  . divalproex (DEPAKOTE) 500 MG DR tablet One tablet daily for 1 week, then take 1 tablet twice a day 60 tablet 2  . moxifloxacin (AVELOX) 400 MG tablet Take 1 tablet (400 mg total) by mouth daily. 7 tablet 0  . betamethasone  acetate-betamethasone sodium phosphate (CELESTONE) injection 6 mg      No facility-administered medications prior to visit.    PAST MEDICAL HISTORY: Past Medical History  Diagnosis Date  . Scoliosis   . GERD (gastroesophageal reflux disease)   . Compression fracture of L1 lumbar vertebra (HCC)   . Cervical spine fracture (HCC)   . Hyperlipidemia   . Hypoglycemia   . Hiatal hernia   . Vertigo 05/31/2015  . Common migraine with intractable migraine 09/16/2015    PAST SURGICAL HISTORY: Past Surgical History  Procedure Laterality Date  . Removal of testicular mass    . Knee surgery    . Arthrscopic knee surgery x8    . Left wrist repair fracture x2    . Lumbar fracture repair x6    . Mouth surgery  08/2015    FAMILY HISTORY: Family History  Problem Relation Age of Onset  . Asthma Mother   . Arthritis Mother   . Stroke Mother   . Heart disease Father   . Colon cancer Neg Hx   . Esophageal cancer Neg Hx   . Rectal cancer Neg Hx   . Stomach cancer Neg Hx   . Cancer Maternal Grandfather     SOCIAL HISTORY: Social History   Social History  . Marital Status: Single    Spouse Name: N/A  . Number of Children: 1  . Years of Education: 14   Occupational History  . [employer]    Social History Main Topics  . Smoking status: Current Every Day Smoker -- 0.50 packs/day for 10 years    Types: Cigarettes    Last Attempt to Quit: 12/18/2014  . Smokeless tobacco: Never Used  . Alcohol Use: No     Comment: rare  . Drug Use: No  . Sexual Activity: Not on file   Other Topics Concern  . Not on file   Social History Narrative   Patient drinks caffeine occasionally.   Patient is right handed.         PHYSICAL EXAM  Filed Vitals:   05/29/16 1134  BP: 118/76  Pulse: 78  Height: 6\' 2"  (1.88 m)  Weight: 213 lb 12.8 oz (96.979 kg)   Body mass index is 27.44 kg/(m^2).  Generalized: Well developed, in no acute distress   Neurological examination  Mentation:  Alert oriented to time, place, history taking. Follows all commands speech and language fluent Cranial nerve II-XII: Pupils were equal round reactive to light. Extraocular movements were full, visual field were full on confrontational test. Facial sensation and strength were normal. Uvula tongue midline. Head turning and shoulder shrug  were normal and symmetric. Motor: The motor testing reveals 5 over 5 strength of all 4 extremities. Good symmetric motor tone is noted throughout.  Sensory: Sensory testing is intact to soft touch on all 4 extremities. No evidence of extinction is noted.  Coordination: Cerebellar testing reveals good finger-nose-finger and heel-to-shin bilaterally.  Gait and station: Gait is normal. Tandem gait is normal. Romberg is negative. No drift is seen.  Reflexes: Deep tendon  reflexes are symmetric and normal bilaterally.   DIAGNOSTIC DATA (LABS, IMAGING, TESTING) - I reviewed patient records, labs, notes, testing and imaging myself where available.  Lab Results  Component Value Date   WBC 7.8 04/15/2016   HGB 15.6 07/21/2015   HCT 49.6 04/15/2016   MCV 88 04/15/2016   PLT 255 04/15/2016      Component Value Date/Time   NA 139 04/15/2016 1138   NA 140 02/16/2015 2125   K 4.1 04/15/2016 1138   CL 98 04/15/2016 1138   CO2 23 04/15/2016 1138   GLUCOSE 90 04/15/2016 1138   GLUCOSE 102* 02/16/2015 2125   BUN 10 04/15/2016 1138   BUN 14 02/16/2015 2125   CREATININE 1.26 04/15/2016 1138   CALCIUM 9.8 04/15/2016 1138   PROT 7.3 04/15/2016 1138   ALBUMIN 4.4 04/15/2016 1138   AST 18 04/15/2016 1138   ALT 17 04/15/2016 1138   ALKPHOS 72 04/15/2016 1138   BILITOT 0.6 04/15/2016 1138   BILITOT 0.3 09/16/2014 1608   GFRNONAA 68 04/15/2016 1138   GFRAA 79 04/15/2016 1138   Lab Results  Component Value Date   CHOL 245* 02/04/2015   HDL 46 02/04/2015   LDLCALC 164* 02/19/2014   TRIG 126 02/04/2015       ASSESSMENT AND PLAN 45 y.o. year old male  has a past  medical history of Scoliosis; GERD (gastroesophageal reflux disease); Compression fracture of L1 lumbar vertebra (HCC); Cervical spine fracture (HCC); Hyperlipidemia; Hypoglycemia; Hiatal hernia; Vertigo (05/31/2015); and Common migraine with intractable migraine (09/16/2015). here with:  1. Migraine headaches 2. Vertigo  I sling to the patient that Depakote is a preventative medication for migraines. He should take the medication on a daily basis. Patient verbalized understanding. He will begin by taking 500 mg 1 tablet daily for one week and then increase to one tablet twice a day thereafter. I will also send the patient to vestibular rehabilitation for vertigo. Patient verbalized understanding. He will follow-up in 6 months with Dr. Anne Hahn.     Butch Penny, MSN, NP-C 05/29/2016, 1:17 PM [facility] [address] [place], [place] [address] [phone]  I reviewed the above note and documentation by the Nurse Practitioner and agree with the history, physical exam, assessment and plan as outlined above. I was immediately available for face-to-face consultation. Huston Foley, MD, PhD [facility])

## 2016-06-09 ENCOUNTER — Ambulatory Visit: Payer: 59

## 2016-06-14 ENCOUNTER — Ambulatory Visit: Payer: 59 | Admitting: Rehabilitative and Restorative Service Providers"

## 2016-06-27 ENCOUNTER — Ambulatory Visit: Payer: 59 | Admitting: Physical Therapy

## 2016-07-05 ENCOUNTER — Ambulatory Visit: Payer: 59 | Attending: Adult Health | Admitting: Physical Therapy

## 2016-07-22 ENCOUNTER — Other Ambulatory Visit: Payer: Self-pay | Admitting: Family Medicine

## 2016-08-15 ENCOUNTER — Other Ambulatory Visit: Payer: Self-pay | Admitting: Family Medicine

## 2016-10-08 IMAGING — DX DG CHEST 2V
2 series · 2 of 2 positions shown · non-contrast
Comparison: February 15, 2015

CLINICAL DATA: Three-month history of chest pain

EXAM:
CHEST  2 VIEW

[chest pa]
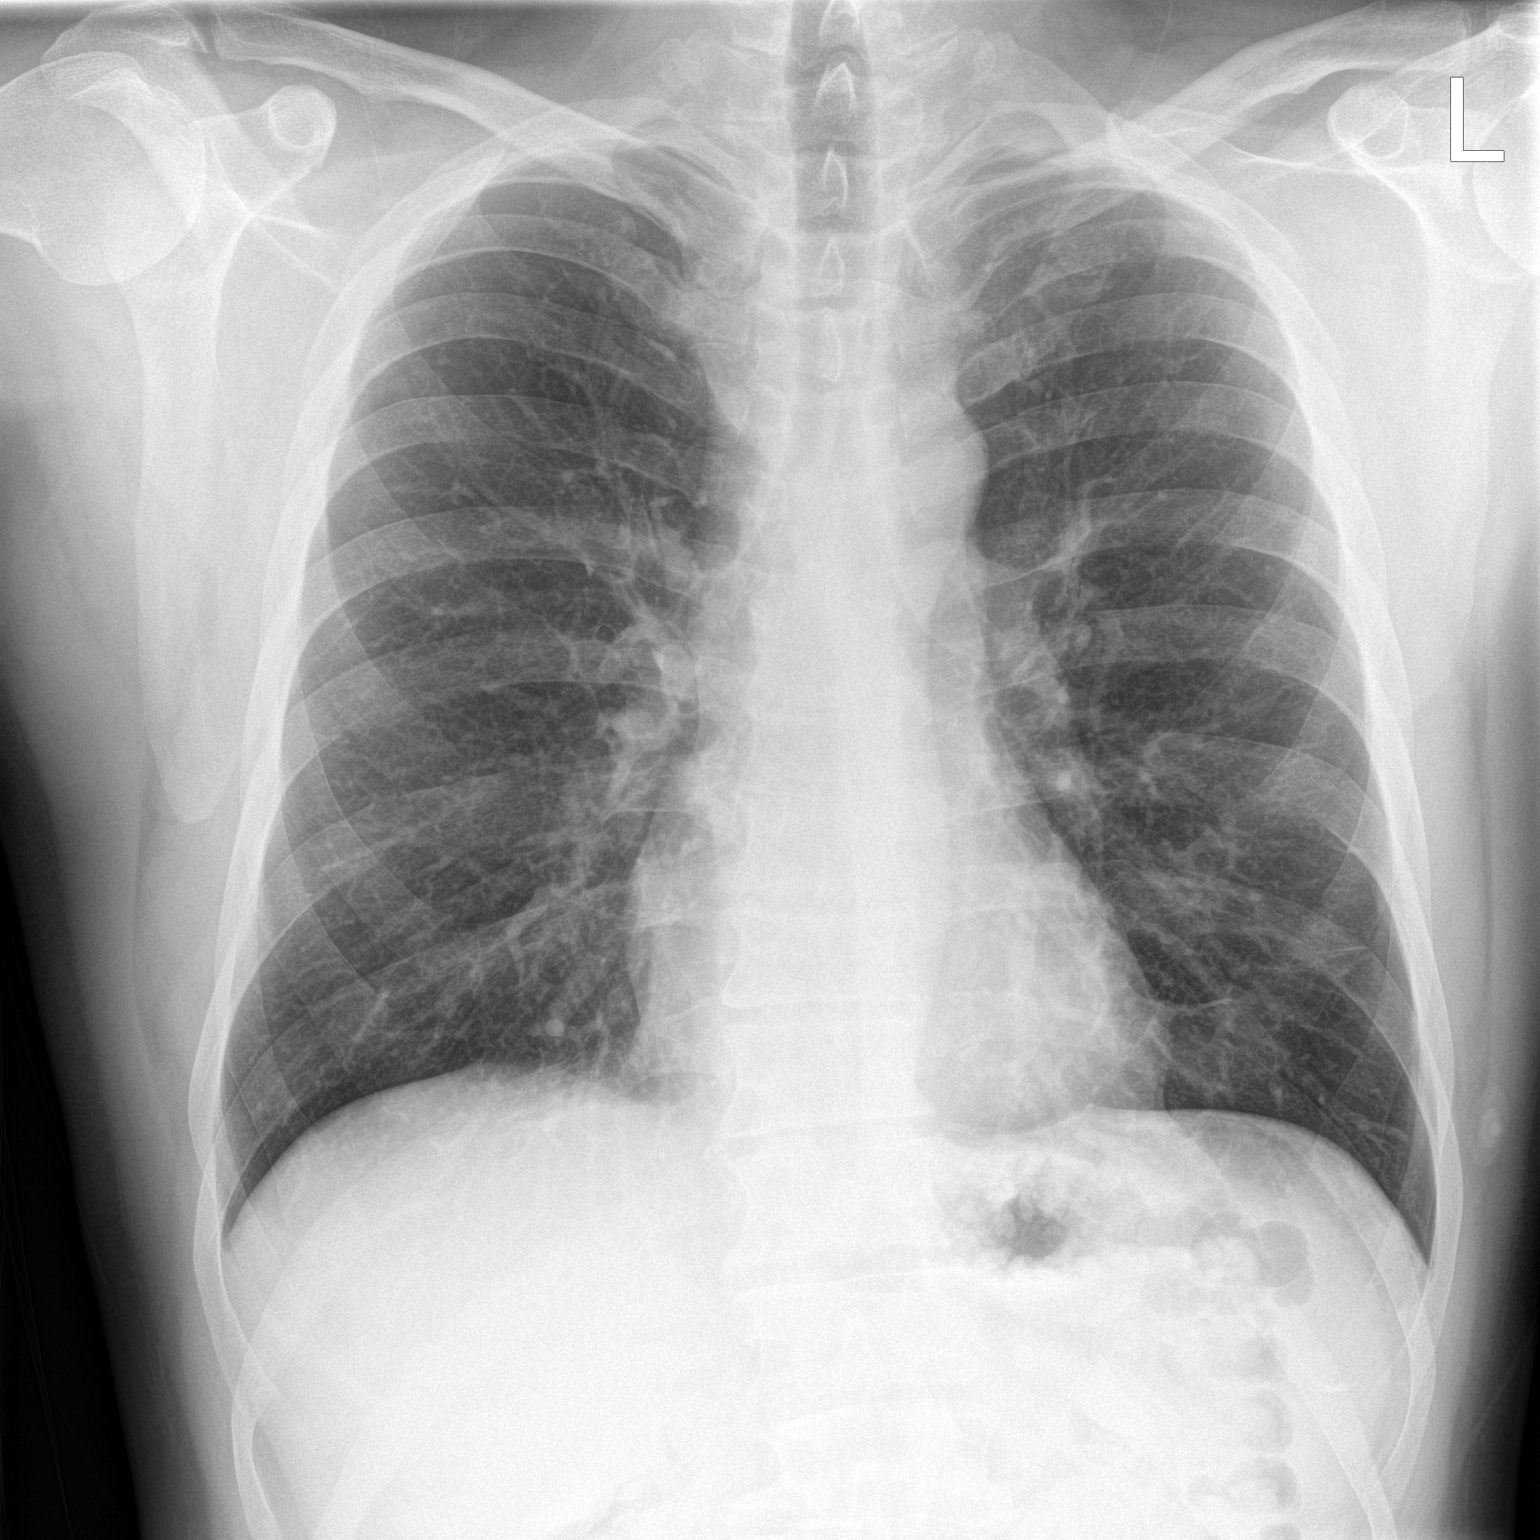

[chest lat]
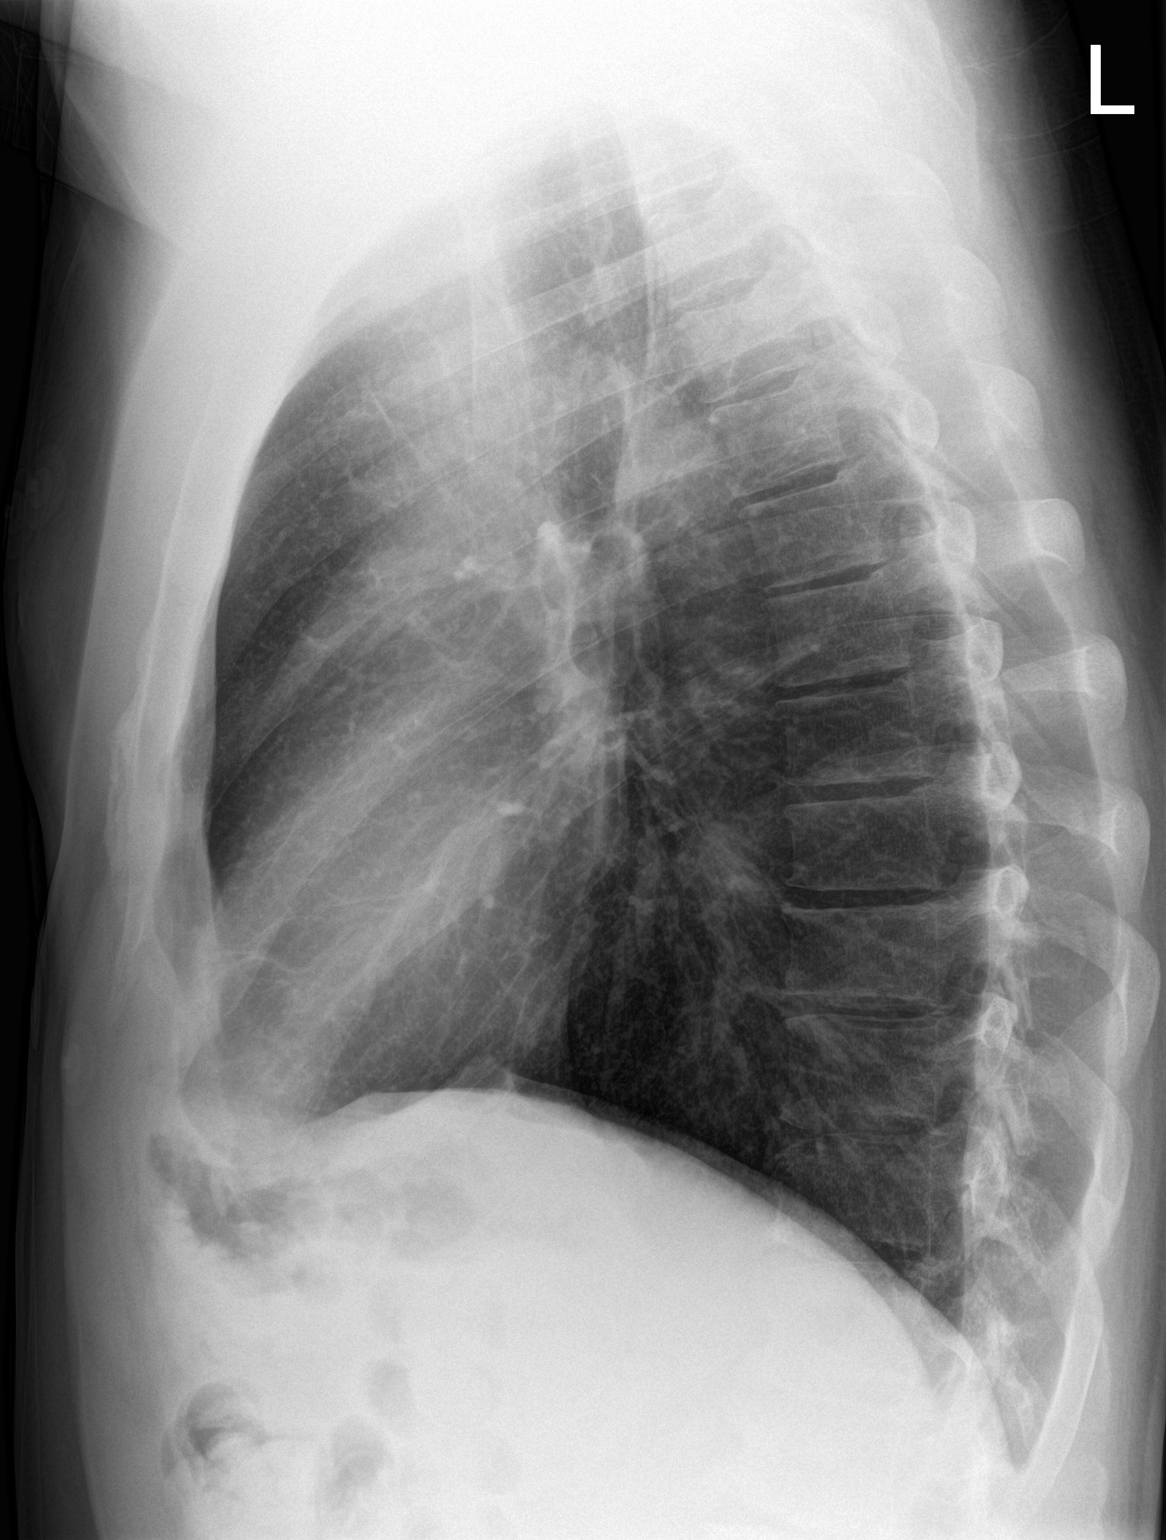

[2 of 2 positions shown; findings below may reference images not displayed]

FINDINGS: There is a degree of underlying emphysematous change, stable. The
interstitium is overall prominent but stable, probably reflecting
chronic inflammatory type change. There is no frank edema or
consolidation. Heart size and pulmonary vascularity are normal. No
adenopathy. No bone lesions.
IMPRESSION: Underlying emphysematous change.  No edema or consolidation.

## 2016-10-17 ENCOUNTER — Telehealth: Payer: Self-pay

## 2016-10-17 MED ORDER — OMEPRAZOLE 40 MG PO CPDR: DELAYED_RELEASE_CAPSULE | ORAL | 0 refills | Status: DC

## 2016-10-19 NOTE — Telephone Encounter (Signed)
x

## 2016-11-11 ENCOUNTER — Encounter: Payer: Self-pay | Admitting: Family Medicine

## 2016-11-11 ENCOUNTER — Ambulatory Visit (INDEPENDENT_AMBULATORY_CARE_PROVIDER_SITE_OTHER): Payer: 59 | Admitting: Family Medicine

## 2016-11-11 VITALS — BP 100/66 | HR 77 | Temp 97.2°F

## 2016-11-11 DIAGNOSIS — Z720 Tobacco use: Secondary | ICD-10-CM | POA: Diagnosis not present

## 2016-11-11 DIAGNOSIS — Z716 Tobacco abuse counseling: Secondary | ICD-10-CM

## 2016-11-11 DIAGNOSIS — J209 Acute bronchitis, unspecified: Secondary | ICD-10-CM | POA: Diagnosis not present

## 2016-11-11 MED ORDER — AZITHROMYCIN 250 MG PO TABS: ORAL_TABLET | ORAL | 0 refills | Status: DC

## 2016-11-11 MED ORDER — NICOTINE POLACRILEX 2 MG MT GUM: 2.0000 mg | CHEWING_GUM | OROMUCOSAL | 0 refills | Status: DC | PRN

## 2016-11-11 MED ORDER — PREDNISONE 20 MG PO TABS: ORAL_TABLET | ORAL | 0 refills | Status: DC

## 2016-11-11 NOTE — Progress Notes (Signed)
BP 100/66   Pulse 77   Temp 97.2 F (36.2 C)    Subjective:    Patient ID: Derrick Wheeler, male    DOB: 03-Jan-1971, 45 y.o.   MRN: [id number]  HPI: Derrick Wheeler is a 45 y.o. male presenting on 11/11/2016 for Cough (x 2 wks) and Sinus Problem   HPI Cough and sinus congestion and chest congestion Patient has been having cough and sinus congestion is been going on for the past 3 weeks. He has developed into chest congestion over the past week. He denies any fevers or shortness of breath but thinks he may have had some wheezing. He has had some chills. His cough is productive of yellow-green sputum which is also something that's just happened over the past 3 or 4 days. He also over the past 3 or 4 days has developed achiness and chills. He denies any fevers. He denies any sick contacts that he knows of. He has not been using any over-the-counter medicines for this.  Relevant past medical, surgical, family and social history reviewed and updated as indicated. Interim medical history since our last visit reviewed. Allergies and medications reviewed and updated.  Review of Systems  Constitutional: Positive for chills. Negative for fever.  HENT: Positive for congestion, postnasal drip, rhinorrhea, sinus pressure and sore throat. Negative for ear discharge, ear pain, sneezing and voice change.   Eyes: Negative for pain, discharge, redness and visual disturbance.  Respiratory: Positive for cough. Negative for shortness of breath and wheezing.   Cardiovascular: Negative for chest pain and leg swelling.  Musculoskeletal: Positive for myalgias. Negative for gait problem.  Skin: Negative for rash.  All other systems reviewed and are negative.   Per HPI unless specifically indicated above      Objective:    BP 100/66   Pulse 77   Temp 97.2 F (36.2 C)   Wt Readings from Last 3 Encounters:  05/29/16 213 lb 12.8 oz (97 kg)  04/15/16 217 lb (98.4 kg)  02/07/16 218 lb 12.8 oz (99.2 kg)     Physical Exam  Constitutional: He is oriented to person, place, and time. He appears well-developed and well-nourished. No distress.  HENT:  Right Ear: Tympanic membrane, external ear and ear canal normal.  Left Ear: Tympanic membrane, external ear and ear canal normal.  Nose: Mucosal edema and rhinorrhea present. No sinus tenderness. No epistaxis. Right sinus exhibits maxillary sinus tenderness. Right sinus exhibits no frontal sinus tenderness. Left sinus exhibits maxillary sinus tenderness. Left sinus exhibits no frontal sinus tenderness.  Mouth/Throat: Uvula is midline and mucous membranes are normal. Posterior oropharyngeal edema and posterior oropharyngeal erythema present. No oropharyngeal exudate or tonsillar abscesses.  Eyes: Conjunctivae are normal. Right eye exhibits no discharge. Left eye exhibits no discharge. No scleral icterus.  Neck: Neck supple. No thyromegaly present.  Cardiovascular: Normal rate, regular rhythm, normal heart sounds and intact distal pulses.   No murmur heard. Pulmonary/Chest: Effort normal. No respiratory distress. He has no wheezes. He has rhonchi in the right upper field and the left upper field. He has no rales.  Musculoskeletal: Normal range of motion. He exhibits no edema.  Lymphadenopathy:    He has no cervical adenopathy.  Neurological: He is alert and oriented to person, place, and time. Coordination normal.  Skin: Skin is warm and dry. No rash noted. He is not diaphoretic.  Psychiatric: He has a normal mood and affect. His behavior is normal.  Nursing note and vitals reviewed.  Assessment & Plan:   Problem List Items Addressed This Visit    None    Visit Diagnoses    Acute bronchitis, unspecified organism    -  Primary   Relevant Medications   azithromycin (ZITHROMAX) 250 MG tablet   predniSONE (DELTASONE) 20 MG tablet   Encounter for smoking cessation counseling       Relevant Medications   nicotine polacrilex ([employer] NICOTINE  POLACRILEX) 2 MG gum       Follow up plan: Return if symptoms worsen or fail to improve.  Counseling provided for all of the vaccine components No orders of the defined types were placed in this encounter.   Arville CareJoshua Dettinger, MD [facility] 11/11/2016, 11:40 AM

## 2016-11-27 ENCOUNTER — Telehealth: Payer: Self-pay

## 2016-11-27 ENCOUNTER — Ambulatory Visit: Payer: 59 | Admitting: Neurology

## 2016-11-27 NOTE — Telephone Encounter (Signed)
Pt no-showed his follow-up appt this morning. 

## 2016-11-28 ENCOUNTER — Encounter: Payer: Self-pay | Admitting: Neurology

## 2016-12-16 ENCOUNTER — Other Ambulatory Visit: Payer: Self-pay | Admitting: Family Medicine

## 2016-12-16 DIAGNOSIS — Z716 Tobacco abuse counseling: Secondary | ICD-10-CM

## 2016-12-22 ENCOUNTER — Ambulatory Visit: Payer: 59 | Admitting: Family Medicine

## 2017-01-12 ENCOUNTER — Other Ambulatory Visit: Payer: Self-pay | Admitting: Family Medicine

## 2017-04-19 ENCOUNTER — Other Ambulatory Visit: Payer: Self-pay | Admitting: Family Medicine

## 2017-05-02 ENCOUNTER — Ambulatory Visit (INDEPENDENT_AMBULATORY_CARE_PROVIDER_SITE_OTHER): Payer: 59 | Admitting: Physician Assistant

## 2017-05-02 ENCOUNTER — Ambulatory Visit (INDEPENDENT_AMBULATORY_CARE_PROVIDER_SITE_OTHER): Payer: 59

## 2017-05-02 ENCOUNTER — Encounter: Payer: Self-pay | Admitting: Physician Assistant

## 2017-05-02 VITALS — BP 117/80 | HR 74 | Temp 96.1°F | Ht 74.0 in | Wt 217.4 lb

## 2017-05-02 DIAGNOSIS — R05 Cough: Secondary | ICD-10-CM | POA: Diagnosis not present

## 2017-05-02 DIAGNOSIS — J40 Bronchitis, not specified as acute or chronic: Secondary | ICD-10-CM

## 2017-05-02 DIAGNOSIS — R059 Cough, unspecified: Secondary | ICD-10-CM

## 2017-05-02 MED ORDER — METHYLPREDNISOLONE ACETATE 80 MG/ML IJ SUSP
80.0000 mg | Freq: Once | INTRAMUSCULAR | Status: AC
  Administered 2017-05-02: 80 mg via INTRAMUSCULAR

## 2017-05-02 MED ORDER — ALBUTEROL SULFATE HFA 108 (90 BASE) MCG/ACT IN AERS: 2.0000 | INHALATION_SPRAY | Freq: Four times a day (QID) | RESPIRATORY_TRACT | 0 refills | Status: DC | PRN

## 2017-05-02 MED ORDER — DOXYCYCLINE HYCLATE 100 MG PO TABS: 100.0000 mg | ORAL_TABLET | Freq: Two times a day (BID) | ORAL | 0 refills | Status: DC

## 2017-05-02 MED ORDER — HYDROCODONE-HOMATROPINE 5-1.5 MG/5ML PO SYRP: 5.0000 mL | ORAL_SOLUTION | Freq: Four times a day (QID) | ORAL | 0 refills | Status: DC | PRN

## 2017-05-02 NOTE — Progress Notes (Signed)
BP 117/80   Pulse 74   Temp (!) 96.1 F (35.6 C) (Oral)   Ht 6\' 2"  (1.88 m)   Wt 217 lb 6.4 oz (98.6 kg)   BMI 27.91 kg/m    Subjective:    Patient ID: Derrick ManchesterDaniel F Josephs, male    DOB: [birth year], 46 y.o.   MRN: [id number]  HPI: Derrick Wheeler is a 46 y.o. male presenting on 05/02/2017 for Nasal Congestion (x 3 days); Facial Pain; and Cough  Patient with several days of progressing upper respiratory and bronchial symptoms. Initially there was more upper respiratory congestion. This progressed to having significant cough that is productive throughout the day and severe at night. There is occasional wheezing after coughing. Sometimes there is slight dyspnea on exertion. It is productive mucus that is yellow in color. Denies any blood.  Relevant past medical, surgical, family and social history reviewed and updated as indicated. Allergies and medications reviewed and updated.  Past Medical History:  Diagnosis Date  . Cervical spine fracture (HCC)   . Common migraine with intractable migraine 09/16/2015  . Compression fracture of L1 lumbar vertebra (HCC)   . GERD (gastroesophageal reflux disease)   . Hiatal hernia   . Hyperlipidemia   . Hypoglycemia   . Scoliosis   . Vertigo 05/31/2015    Past Surgical History:  Procedure Laterality Date  . arthrscopic knee surgery x8    . KNEE SURGERY    . left wrist repair fracture x2    . lumbar fracture repair x6    . MOUTH SURGERY  08/2015  . Removal of testicular mass      Review of Systems  Constitutional: Positive for fatigue and fever. Negative for appetite change.  HENT: Positive for sinus pressure and sore throat.   Eyes: Negative.  Negative for pain and visual disturbance.  Respiratory: Positive for cough, shortness of breath and wheezing. Negative for chest tightness.   Cardiovascular: Negative.  Negative for chest pain, palpitations and leg swelling.  Gastrointestinal: Negative.  Negative for abdominal pain, diarrhea, nausea and  vomiting.  Endocrine: Negative.   Genitourinary: Negative.   Musculoskeletal: Positive for back pain and myalgias.  Skin: Negative.  Negative for color change and rash.  Neurological: Positive for headaches. Negative for weakness and numbness.  Psychiatric/Behavioral: Negative.     Allergies as of 05/02/2017      Reactions   Meloxicam Shortness Of Breath, Rash   Other    Bee sting   Codeine Rash   Keflex [cephalexin] Rash   Sulfa Antibiotics Rash   Sulfacetamide Sodium Rash      Medication List       Accurate as of 05/02/17  8:26 AM. Always use your most recent med list.          divalproex 500 MG DR tablet Commonly known as:  DEPAKOTE Take 500 mg by mouth as needed.   multivitamin with minerals Tabs tablet Take 1 tablet by mouth daily. Reported on 01/27/2016   omeprazole 40 MG capsule Commonly known as:  PRILOSEC TAKE 1 CAPSULE (40 MG TOTAL) BY MOUTH DAILY.          Objective:    BP 117/80   Pulse 74   Temp (!) 96.1 F (35.6 C) (Oral)   Ht 6\' 2"  (1.88 m)   Wt 217 lb 6.4 oz (98.6 kg)   BMI 27.91 kg/m   Allergies  Allergen Reactions  . Meloxicam Shortness Of Breath and Rash  . Other  Bee sting  . Codeine Rash  . Keflex [Cephalexin] Rash  . Sulfa Antibiotics Rash  . Sulfacetamide Sodium Rash    Physical Exam  Constitutional: He appears well-developed and well-nourished.  HENT:  Head: Normocephalic and atraumatic.  Right Ear: Hearing and tympanic membrane normal.  Left Ear: Hearing and tympanic membrane normal.  Nose: Mucosal edema and sinus tenderness present. No nasal deformity. Right sinus exhibits frontal sinus tenderness. Left sinus exhibits frontal sinus tenderness.  Mouth/Throat: Posterior oropharyngeal erythema present.  Eyes: Conjunctivae and EOM are normal. Pupils are equal, round, and reactive to light. Right eye exhibits no discharge. Left eye exhibits no discharge.  Neck: Normal range of motion. Neck supple.  Cardiovascular: Normal  rate, regular rhythm and normal heart sounds.   Pulmonary/Chest: Effort normal. No respiratory distress. He has no decreased breath sounds. He has wheezes. He has no rhonchi. He has no rales.  Abdominal: Soft. Bowel sounds are normal.  Musculoskeletal: Normal range of motion.  Skin: Skin is warm and dry.        Assessment & Plan:   1. Cough - DG Chest 2 View; clear findings - methylPREDNISolone acetate (DEPO-MEDROL) injection 80 mg; Inject 1 mL (80 mg total) into the muscle once.  2. Bronchitis - DG Chest 2 View; Future - methylPREDNISolone acetate (DEPO-MEDROL) injection 80 mg; Inject 1 mL (80 mg total) into the muscle once. - albuterol (PROVENTIL HFA;VENTOLIN HFA) 108 (90 Base) MCG/ACT inhaler; Inhale 2 puffs into the lungs every 6 (six) hours as needed for wheezing or shortness of breath.  Dispense: 1 Inhaler; Refill: 0 - doxycycline (VIBRA-TABS) 100 MG tablet; Take 1 tablet (100 mg total) by mouth 2 (two) times daily.  Dispense: 20 tablet; Refill: 0 - HYDROcodone-homatropine (HYCODAN) 5-1.5 MG/5ML syrup; Take 5 mLs by mouth every 6 (six) hours as needed for cough.  Dispense: 120 mL; Refill: 0    Current Outpatient Prescriptions:  .  divalproex (DEPAKOTE) 500 MG DR tablet, Take 500 mg by mouth as needed., Disp: , Rfl:  .  Multiple Vitamin (MULTIVITAMIN WITH MINERALS) TABS tablet, Take 1 tablet by mouth daily. Reported on 01/27/2016, Disp: , Rfl:  .  omeprazole (PRILOSEC) 40 MG capsule, TAKE 1 CAPSULE (40 MG TOTAL) BY MOUTH DAILY., Disp: 90 capsule, Rfl: 0 .  albuterol (PROVENTIL HFA;VENTOLIN HFA) 108 (90 Base) MCG/ACT inhaler, Inhale 2 puffs into the lungs every 6 (six) hours as needed for wheezing or shortness of breath., Disp: 1 Inhaler, Rfl: 0 .  doxycycline (VIBRA-TABS) 100 MG tablet, Take 1 tablet (100 mg total) by mouth 2 (two) times daily., Disp: 20 tablet, Rfl: 0 .  HYDROcodone-homatropine (HYCODAN) 5-1.5 MG/5ML syrup, Take 5 mLs by mouth every 6 (six) hours as needed for  cough., Disp: 120 mL, Rfl: 0  Continue all other maintenance medications as listed above.  Follow up plan: Return if symptoms worsen or fail to improve.  Educational handout given for bronchitis  Remus Loffler PA-C [facility] [address]  [place], [place] [address] [phone]   05/02/2017, 8:26 AM

## 2017-05-02 NOTE — Patient Instructions (Signed)

## 2017-05-03 ENCOUNTER — Encounter: Payer: Self-pay | Admitting: Family Medicine

## 2017-08-09 ENCOUNTER — Other Ambulatory Visit: Payer: Self-pay | Admitting: Family Medicine

## 2017-09-17 DIAGNOSIS — R918 Other nonspecific abnormal finding of lung field: Secondary | ICD-10-CM

## 2017-09-17 HISTORY — DX: Other nonspecific abnormal finding of lung field: R91.8

## 2018-05-27 ENCOUNTER — Ambulatory Visit (INDEPENDENT_AMBULATORY_CARE_PROVIDER_SITE_OTHER): Payer: 59 | Admitting: Gastroenterology

## 2018-05-27 ENCOUNTER — Encounter: Payer: Self-pay | Admitting: Gastroenterology

## 2018-05-27 VITALS — BP 112/70 | HR 76 | Ht 74.0 in | Wt 222.4 lb

## 2018-05-27 DIAGNOSIS — K219 Gastro-esophageal reflux disease without esophagitis: Secondary | ICD-10-CM | POA: Diagnosis not present

## 2018-05-27 DIAGNOSIS — R1011 Right upper quadrant pain: Secondary | ICD-10-CM | POA: Diagnosis not present

## 2018-05-27 DIAGNOSIS — R194 Change in bowel habit: Secondary | ICD-10-CM

## 2018-05-27 MED ORDER — NA SULFATE-K SULFATE-MG SULF 17.5-3.13-1.6 GM/177ML PO SOLN: 1.0000 | ORAL | 0 refills | Status: DC

## 2018-05-27 NOTE — Progress Notes (Signed)
05/27/2018 Derrick ManchesterDaniel F Wheeler [id number] 08/24/1971   HISTORY OF PRESENT ILLNESS: This is a 47 year old male who is known to Dr. [name] from an endoscopy in March 2016.  At that time he was found to have a small hiatal hernia and duodenitis in the duodenal bulb.  Biopsies of GE junction showed findings consistent with reflux.  He has not been seen here since that time.  He presents here today with complaints of diarrhea and abdominal pain.  He tells me that he has been feeling bad for about the past several months.  He says that around the beginning of the year he developed some right upper quadrant abdominal pain.  He is very vague on his explanation of this pain, but it seems that it is present to some degree all the time, possibly worsening at times.  In regards to evaluation of this right upper quadrant abdominal pain he had an ultrasound of the gallbladder performed on December 28, 2017 and that was otherwise unremarkable.  He also had a HIDA scan performed on January 07, 2018 that showed a normal exam with gallbladder ejection fraction calculated at 93%.  CT scan abdomen pelvis with IV contrast on December 28, 2017 showed no acute findings.  Anyway, since then, about beginning of April, he has developed other generalized abdominal pains as well as diarrhea.  He says he is having bowel movements now currently 4-5 times per day and they are more just soft stools, not really watery.  He denies nocturnal stools and he denies seeing blood in his stools.  He says that they tend to be copper or orange in color.  He says that he has been treated with antibiotics, both Cipro and Flagyl on a couple of occasions.  He says that for about 1.5 to 2 weeks his bowel movements seemed to go back to normal, but then have become loose/very soft again.  He has had 3 CT scans since the beginning of April, with 4 CT scans total so far this year as well as his ultrasound and his HIDA scan that were used for evaluation of  his gallbladder.  CT scan abdomen pelvis with IV contrast on March 19, 2018 showed mild wall thickening involving the sigmoid colon with diverticulosis may be representing early acute sigmoid diverticulitis with no abscess.  CT scan abdomen and pelvis with IV contrast on March 23, 2018 showed diverticulosis without diverticulitis, and fatty liver.  CT scan abdomen and pelvis with IV contrast on May 07, 2018 showed gastroenteritis and colonic diverticulosis without acute diverticulitis.  On May 20 he did have a slight elevation his white blood cell count at 12, but otherwise CBC and CMP have been unremarkable.  Had negative C. difficile stool study on May 10, 2018.     Past Medical History:  Diagnosis Date  . Cervical spine fracture (HCC)   . Common migraine with intractable migraine 09/16/2015  . Compression fracture of L1 lumbar vertebra (HCC)   . GERD (gastroesophageal reflux disease)   . Hiatal hernia   . Hyperlipidemia   . Hypoglycemia   . Kidney infection   . Scoliosis   . Vertigo 05/31/2015   Past Surgical History:  Procedure Laterality Date  . arthrscopic knee surgery x8    . KIDNEY STONE SURGERY    . KNEE SURGERY    . left wrist repair fracture x2    . lumbar fracture repair x6    . MOUTH SURGERY  08/2015  . Removal  of testicular mass      reports that he has been smoking cigarettes.  He has a 5.00 pack-year smoking history. He has never used smokeless tobacco. He reports that he does not drink alcohol or use drugs. family history includes Arthritis in his mother; Asthma in his mother; Cancer in his maternal grandfather; Heart disease in his father; Stroke in his mother. Allergies  Allergen Reactions  . Meloxicam Shortness Of Breath and Rash  . Other     Bee sting  . Codeine Rash  . Keflex [Cephalexin] Rash  . Sulfa Antibiotics Rash  . Sulfacetamide Sodium Rash      Outpatient Encounter Medications as of 05/27/2018  Medication Sig  . ciprofloxacin (CIPRO) 500 MG  tablet TAKE ONE TABLET (500 MG DOSE) BY MOUTH 2 (TWO) TIMES DAILY FOR 10 DAYS.  Marland Kitchen Multiple Vitamin (MULTIVITAMIN WITH MINERALS) TABS tablet Take 1 tablet by mouth daily. Reported on 01/27/2016  . omeprazole (PRILOSEC) 40 MG capsule TAKE 1 CAPSULE (40 MG TOTAL) BY MOUTH DAILY.  . [DISCONTINUED] albuterol (PROVENTIL HFA;VENTOLIN HFA) 108 (90 Base) MCG/ACT inhaler Inhale 2 puffs into the lungs every 6 (six) hours as needed for wheezing or shortness of breath.  . [DISCONTINUED] divalproex (DEPAKOTE) 500 MG DR tablet Take 500 mg by mouth as needed.  . [DISCONTINUED] doxycycline (VIBRA-TABS) 100 MG tablet Take 1 tablet (100 mg total) by mouth 2 (two) times daily.  . [DISCONTINUED] HYDROcodone-homatropine (HYCODAN) 5-1.5 MG/5ML syrup Take 5 mLs by mouth every 6 (six) hours as needed for cough.   No facility-administered encounter medications on file as of 05/27/2018.      REVIEW OF SYSTEMS  : All other systems reviewed and negative except where noted in the History of Present Illness.   PHYSICAL EXAM: BP 112/70   Pulse 76   Ht 6\' 2"  (1.88 m)   Wt 222 lb 6 oz (100.9 kg)   BMI 28.55 kg/m  General: Well developed white male in no acute distress Head: Normocephalic and atraumatic Eyes:  Sclerae anicteric, conjunctiva pink. Ears: Normal auditory acuity Lungs: Clear throughout to auscultation; no increased WOB. Heart: Regular rate and rhythm; no M/R/G. Abdomen: Soft, non-distended.  BS present.  Diffuse TTP, but more so in RUQ. Rectal:  Will be done at the time of colonoscopy. Musculoskeletal: Symmetrical with no gross deformities  Skin: No lesions on visible extremities Extremities: No edema  Neurological: Alert oriented x 4, grossly non-focal Psychological:  Alert and cooperative. Normal mood and affect  ASSESSMENT AND PLAN: *Change in bowel habits with loose/soft stools and generalized abdominal pain:  Over the past couple of months he's had a CT scan suggesting diverticulitis, one suggesting  gastroenteritis, and 2 normal CT's as well.  Unsure what has been causing his issues recently for sure.  Has been treated with cipro and flagyl on a couple of occasions.  Cdiff negative.  Will schedule for colonoscopy with Dr. Russella Dar.  Will start daily probiotic in the form of Florastor or VSL #3. *RUQ abdominal pain:  Unsure of source at this point.  Await colonoscopy results.  **The risks, benefits, and alternatives to colonoscopy were discussed with the patient and he consents to proceed.    CC:  Paulina Fusi Lanice Shirts, PA-C

## 2018-05-27 NOTE — Patient Instructions (Addendum)
Start Florastor probiotic or VSL #3 (found at [facility])  You have been scheduled for a colonoscopy. Please follow written instructions given to you at your visit today.  Please pick up your prep supplies at the pharmacy within the next 1-3 days. If you use inhalers (even only as needed), please bring them with you on the day of your procedure. Your physician has requested that you go to www.startemmi.com and enter the access code given to you at your visit today. This web site gives a general overview about your procedure. However, you should still follow specific instructions given to you by our office regarding your preparation for the procedure.

## 2018-05-29 ENCOUNTER — Encounter: Payer: Self-pay | Admitting: Gastroenterology

## 2018-05-31 ENCOUNTER — Encounter: Payer: Self-pay | Admitting: Gastroenterology

## 2018-05-31 DIAGNOSIS — K219 Gastro-esophageal reflux disease without esophagitis: Secondary | ICD-10-CM | POA: Insufficient documentation

## 2018-05-31 DIAGNOSIS — R194 Change in bowel habit: Secondary | ICD-10-CM | POA: Insufficient documentation

## 2018-05-31 DIAGNOSIS — R1011 Right upper quadrant pain: Secondary | ICD-10-CM | POA: Insufficient documentation

## 2018-06-02 NOTE — Progress Notes (Signed)
Reviewed and agree with initial management plan.  Trevor Duty T. Lemont Sitzmann, MD FACG 

## 2018-06-04 ENCOUNTER — Telehealth: Payer: Self-pay | Admitting: Gastroenterology

## 2018-06-04 NOTE — Telephone Encounter (Signed)
Patient states he is worse now than when he saw [place]. Reports he has had 7-8 stools today. States "My insides are hurting worse." Pain is in right quad at the rib cage area. Stools are liquid to semi solid and occur within 20 minutes of eating. He has been up during the night to have stools also. Denies blood in stools. Rectum is irritated. Scheduled for colonoscopy with Dr. [name] on 06/11/18. Patient states he is to the point of going to the hospital because of the pain.

## 2018-06-04 NOTE — Telephone Encounter (Signed)
He has colonoscopy coming up next week so that will hopefully help with diagnosis.  Let's try bentyl 20 mg BID in the interim to see if that will help with cramping and spasm.

## 2018-06-05 ENCOUNTER — Other Ambulatory Visit: Payer: Self-pay | Admitting: *Deleted

## 2018-06-05 MED ORDER — DICYCLOMINE HCL 20 MG PO TABS: ORAL_TABLET | ORAL | 0 refills | Status: DC

## 2018-06-05 NOTE — Progress Notes (Signed)
Patient notified of recommendation. Rx sent to pharmacy. 

## 2018-06-10 ENCOUNTER — Telehealth: Payer: Self-pay | Admitting: Gastroenterology

## 2018-06-10 NOTE — Telephone Encounter (Signed)
Patient will see surgery today.  He plans to keep the appt for tomorrow unless the surgeon advises against it.

## 2018-06-11 ENCOUNTER — Ambulatory Visit (AMBULATORY_SURGERY_CENTER): Payer: 59 | Admitting: Gastroenterology

## 2018-06-11 ENCOUNTER — Encounter: Payer: Self-pay | Admitting: Gastroenterology

## 2018-06-11 ENCOUNTER — Other Ambulatory Visit: Payer: Self-pay

## 2018-06-11 VITALS — BP 90/63 | HR 66 | Temp 98.9°F | Resp 11 | Ht 74.0 in | Wt 222.0 lb

## 2018-06-11 DIAGNOSIS — R194 Change in bowel habit: Secondary | ICD-10-CM

## 2018-06-11 DIAGNOSIS — R933 Abnormal findings on diagnostic imaging of other parts of digestive tract: Secondary | ICD-10-CM

## 2018-06-11 MED ORDER — SODIUM CHLORIDE 0.9 % IV SOLN: 500.0000 mL | Freq: Once | INTRAVENOUS | Status: DC

## 2018-06-11 NOTE — Op Note (Signed)
[facility] Patient Name: Derrick Wheeler Procedure Date: 06/11/2018 10:54 AM MRN: [id number] Endoscopist: Meryl Dare , MD Age: 47 Referring MD:  Date of Birth: January 30, 1971 Gender: Male Account #: [id number] Procedure:                Colonoscopy Indications:              Abnormal CT of the GI tract, Change in bowel                            habits, Clinically significant diarrhea of                            unexplained origin Medicines:                Monitored Anesthesia Care Procedure:                Pre-Anesthesia Assessment:                           - Prior to the procedure, a History and Physical                            was performed, and patient medications and                            allergies were reviewed. The patient's tolerance of                            previous anesthesia was also reviewed. The risks                            and benefits of the procedure and the sedation                            options and risks were discussed with the patient.                            All questions were answered, and informed consent                            was obtained. Prior Anticoagulants: The patient has                            taken no previous anticoagulant or antiplatelet                            agents. ASA Grade Assessment: II - A patient with                            mild systemic disease. After reviewing the risks                            and benefits, the patient was deemed in  satisfactory condition to undergo the procedure.                           After obtaining informed consent, the colonoscope                            was passed under direct vision. Throughout the                            procedure, the patient's blood pressure, pulse, and                            oxygen saturations were monitored continuously. The                            Colonoscope was introduced through the anus and                         advanced to the the terminal ileum, with                            identification of the appendiceal orifice and IC                            valve. The terminal ileum, ileocecal valve,                            appendiceal orifice, and rectum were photographed.                            The quality of the bowel preparation was good. The                            colonoscopy was performed without difficulty. The                            patient tolerated the procedure well. Scope In: 11:03:04 AM Scope Out: 11:14:16 AM Scope Withdrawal Time: 0 hours 10 minutes 6 seconds  Total Procedure Duration: 0 hours 11 minutes 12 seconds  Findings:                 The perianal and digital rectal examinations were                            normal.                           The terminal ileum appeared normal.                           Multiple small-mouthed diverticula were found in                            the left colon. There was narrowing of the colon in  association with the diverticular opening. There                            was evidence of diverticular spasm.                            Peri-diverticular erythema was seen. There was no                            evidence of diverticular bleeding.                           Internal hemorrhoids were found during                            retroflexion. The hemorrhoids were small and Grade                            I (internal hemorrhoids that do not prolapse).                           The exam was otherwise without abnormality on                            direct and retroflexion views. Random biopsies                            obtained throughout the colon. Complications:            No immediate complications. Estimated blood loss:                            None. Estimated Blood Loss:     Estimated blood loss: none. Impression:               - The examined portion of the ileum was  normal.                           - Moderate diverticulosis in the left colon.                           - Small internal hemorrhoids.                           - The examination was otherwise normal on direct                            and retroflexion views. Random biopsies obtained. Recommendation:           - Repeat colonoscopy in 10 years for screening                            purposes.                           - Patient has a contact number available for  emergencies. The signs and symptoms of potential                            delayed complications were discussed with the                            patient. Return to normal activities tomorrow.                            Written discharge instructions were provided to the                            patient.                           - Resume previous diet.                           - Continue present medications.                           - Await pathology results.                           - Continue probiotics. Meryl Dare, MD 06/11/2018 11:22:15 AM This report has been signed electronically.

## 2018-06-11 NOTE — Patient Instructions (Signed)
Discharge instructions given. Handouts on diverticulosis and hemorrhoids. Resume previous medications. YOU HAD AN ENDOSCOPIC PROCEDURE TODAY AT THE [facility]:   Refer to the procedure report that was given to you for any specific questions about what was found during the examination.  If the procedure report does not answer your questions, please call your gastroenterologist to clarify.  If you requested that your care partner not be given the details of your procedure findings, then the procedure report has been included in a sealed envelope for you to review at your convenience later.  YOU SHOULD EXPECT: Some feelings of bloating in the abdomen. Passage of more gas than usual.  Walking can help get rid of the air that was put into your GI tract during the procedure and reduce the bloating. If you had a lower endoscopy (such as a colonoscopy or flexible sigmoidoscopy) you may notice spotting of blood in your stool or on the toilet paper. If you underwent a bowel prep for your procedure, you may not have a normal bowel movement for a few days.  Please Note:  You might notice some irritation and congestion in your nose or some drainage.  This is from the oxygen used during your procedure.  There is no need for concern and it should clear up in a day or so.  SYMPTOMS TO REPORT IMMEDIATELY:   Following lower endoscopy (colonoscopy or flexible sigmoidoscopy):  Excessive amounts of blood in the stool  Significant tenderness or worsening of abdominal pains  Swelling of the abdomen that is new, acute  Fever of 100F or higher   For urgent or emergent issues, a gastroenterologist can be reached at any hour by calling [phone].   DIET:  We do recommend a small meal at first, but then you may proceed to your regular diet.  Drink plenty of fluids but you should avoid alcoholic beverages for 24 hours.  ACTIVITY:  You should plan to take it easy for the rest of today and you should  NOT DRIVE or use heavy machinery until tomorrow (because of the sedation medicines used during the test).    FOLLOW UP: Our staff will call the number listed on your records the next business day following your procedure to check on you and address any questions or concerns that you may have regarding the information given to you following your procedure. If we do not reach you, we will leave a message.  However, if you are feeling well and you are not experiencing any problems, there is no need to return our call.  We will assume that you have returned to your regular daily activities without incident.  If any biopsies were taken you will be contacted by phone or by letter within the next 1-3 weeks.  Please call us at [phone] if you have not heard about the biopsies in 3 weeks.    SIGNATURES/CONFIDENTIALITY: You and/or your care partner have signed paperwork which will be entered into your electronic medical record.  These signatures attest to the fact that that the information above on your After Visit Summary has been reviewed and is understood.  Full responsibility of the confidentiality of this discharge information lies with you and/or your care-partner. 

## 2018-06-11 NOTE — Progress Notes (Signed)
Report to PACU, RN, vss, BBS= Clear.  

## 2018-06-11 NOTE — Progress Notes (Signed)
Called to room to assist during endoscopic procedure.  Patient ID and intended procedure confirmed with present staff. Received instructions for my participation in the procedure from the performing physician.  

## 2018-06-12 ENCOUNTER — Telehealth: Payer: Self-pay

## 2018-06-12 NOTE — Telephone Encounter (Signed)
  Follow up Call-  Call back number 06/11/2018  Post procedure Call Back phone  # [phone]  Permission to leave phone message Yes  Some recent data might be hidden     Patient questions:  Do you have a fever, pain , or abdominal swelling? No. Pain Score  0 *  Have you tolerated food without any problems? Yes.    Have you been able to return to your normal activities? Yes.    Do you have any questions about your discharge instructions: Diet   No. Medications  No. Follow up visit  No.  Do you have questions or concerns about your Care? No.  Actions: * If pain score is 4 or above: No action needed, pain <4.

## 2018-06-23 ENCOUNTER — Encounter: Payer: Self-pay | Admitting: Gastroenterology

## 2018-08-05 ENCOUNTER — Encounter

## 2019-05-16 ENCOUNTER — Other Ambulatory Visit: Payer: Self-pay

## 2019-05-16 ENCOUNTER — Encounter: Payer: Self-pay | Admitting: Gastroenterology

## 2019-05-16 ENCOUNTER — Ambulatory Visit (INDEPENDENT_AMBULATORY_CARE_PROVIDER_SITE_OTHER): Payer: 59 | Admitting: Gastroenterology

## 2019-05-16 VITALS — Ht 74.0 in | Wt 228.0 lb

## 2019-05-16 DIAGNOSIS — R197 Diarrhea, unspecified: Secondary | ICD-10-CM

## 2019-05-16 DIAGNOSIS — K219 Gastro-esophageal reflux disease without esophagitis: Secondary | ICD-10-CM | POA: Diagnosis not present

## 2019-05-16 MED ORDER — PANTOPRAZOLE SODIUM 40 MG PO TBEC: 40.0000 mg | DELAYED_RELEASE_TABLET | Freq: Two times a day (BID) | ORAL | 11 refills | Status: AC

## 2019-05-16 NOTE — Progress Notes (Signed)
    History of Present Illness: This is a 48 year old male with GERD who relates worsening problems with GERD over the past 4 to 5 months.  He watches his diet for foods and beverages that precipitate symptoms.  About 1 month ago he was changed from omeprazole 40 mg daily to pantoprazole 40 mg daily with no change in symptoms.  He states that he has had problems with postprandial diarrhea and abdominal cramping that occur frequently since his cholecystectomy last year.  The symptoms have generally improved over time.  He finds that high fat foods and greasy foods always exacerbate his symptoms.  Other foods cause symptoms intermittently and less frequently.   EGD 02/2015 small hiatal hernia, mild duodenitis colonoscopy 05/2018 diverticulosis and internal hemorrhoids.   Current Medications, Allergies, Past Medical History, Past Surgical History, Family History and Social History were reviewed in [employer] record.  Physical Exam: Telemedicine - not performed   Assessment and Recommendations:  1. GERD. Increase pantoprazole to 40 mg po bid, 1 year of refills. TUMS prn. Antireflux measures. REV in 1 month.   2. Intermittent post prandial diarrhea with crampy abdominal pain since cholecystectomy in 2019. Fat modified diet. Take dicyclomine 10 mg tid prn for abdominal pain.   These services were provided via telemedicine, audio only after technical difficulties.  The patient was at home and the provider was in the office, alone.  We discussed the limitations of evaluation and management by telemedicine and the availability of in person appointments.  Patient consented for this telemedicine visit and is aware of possible charges for this service.  Office CMA or LPN participated in this telemedicine service.  Time spent on call: 10 minutes

## 2019-05-16 NOTE — Patient Instructions (Addendum)
Increase pantoprazole to 40 mg po bid, 1 year of refills. TUMS prn. Antireflux measures.  Prescription sent to your pharmacy.  Antireflux information mailed out to you  Follow up appointment on 06/16/19 at 3:30pm  Thank you for entrusting me with your care and choosing [facility].  Dr Russella Dar

## 2019-06-13 ENCOUNTER — Other Ambulatory Visit: Payer: Self-pay

## 2019-06-16 ENCOUNTER — Encounter: Payer: Self-pay | Admitting: Gastroenterology

## 2019-06-16 ENCOUNTER — Ambulatory Visit (INDEPENDENT_AMBULATORY_CARE_PROVIDER_SITE_OTHER): Payer: 59 | Admitting: Gastroenterology

## 2019-06-16 VITALS — Ht 74.0 in | Wt 230.0 lb

## 2019-06-16 DIAGNOSIS — R109 Unspecified abdominal pain: Secondary | ICD-10-CM | POA: Diagnosis not present

## 2019-06-16 DIAGNOSIS — R197 Diarrhea, unspecified: Secondary | ICD-10-CM

## 2019-06-16 DIAGNOSIS — K219 Gastro-esophageal reflux disease without esophagitis: Secondary | ICD-10-CM | POA: Diagnosis not present

## 2019-06-16 NOTE — Progress Notes (Signed)
    History of Present Illness: This is a 48 year old male returning for follow-up of GERD.  Since increasing pantoprazole to twice daily and intensifying antireflux measures his reflux symptoms are now under very good control.  He finds that coffee routinely worsens his reflux symptoms so he was cut back substantially but still takes a small amount of coffee each day.  He has had a few episodes of significant postprandial abdominal cramping that have responded well to dicyclomine.  He notes that almond milk has led to abdominal discomfort so he has discontinued using it.  Current Medications, Allergies, Past Medical History, Past Surgical History, Family History and Social History were reviewed in [employer] record.   Physical Exam: Telemedicine - not performed    Assessment and Recommendations:  1. GERD.  Continue pantoprazole 40 mg twice daily, refills for 1 year.  Follow all standard antireflux measures. REV in 6 months.   2. Intermittent post prandial diarrhea since cholecystectomy in 2019. Intermittent crampy abdominal pain. Dicyclomine 10 mg p.o. 3 times daily as needed.    These services were provided via telemedicine, audio and video.  The patient was at home and the provider was in the office, alone.  We discussed the limitations of evaluation and management by telemedicine and the availability of in person appointments.  Patient consented for this telemedicine visit and is aware of possible charges for this service.  Office CMA or LPN participated in this telemedicine service.  Time spent on call: 11 minutes

## 2019-06-16 NOTE — Patient Instructions (Addendum)
Continue pantoprazole 40 mg twice daily.   Patient advised to avoid spicy, acidic, citrus, chocolate, mints, fruit and fruit juices.  Limit the intake of caffeine, alcohol and Soda.  Don't exercise too soon after eating.  Don't lie down within 3-4 hours of eating.  Elevate the head of your bed.  Thank you for choosing me and [facility].  Pricilla Riffle. Dagoberto Ligas., MD., Marval Regal

## 2019-08-05 ENCOUNTER — Telehealth: Payer: Self-pay

## 2019-08-05 NOTE — Telephone Encounter (Signed)
Received fax approval for pantoprazole 40 mg bid from [facility] and is approved through 07/30/20.

## 2020-10-29 ENCOUNTER — Institutional Professional Consult (permissible substitution): Payer: Self-pay | Admitting: Neurology

## 2020-11-16 ENCOUNTER — Telehealth: Payer: Self-pay | Admitting: Gastroenterology

## 2020-11-16 ENCOUNTER — Ambulatory Visit (INDEPENDENT_AMBULATORY_CARE_PROVIDER_SITE_OTHER): Payer: BC Managed Care – PPO | Admitting: Gastroenterology

## 2020-11-16 ENCOUNTER — Encounter: Payer: Self-pay | Admitting: Gastroenterology

## 2020-11-16 VITALS — BP 130/70 | HR 93 | Ht 74.0 in | Wt 248.0 lb

## 2020-11-16 DIAGNOSIS — R1032 Left lower quadrant pain: Secondary | ICD-10-CM

## 2020-11-16 DIAGNOSIS — K5732 Diverticulitis of large intestine without perforation or abscess without bleeding: Secondary | ICD-10-CM | POA: Diagnosis not present

## 2020-11-16 MED ORDER — AMOXICILLIN-POT CLAVULANATE 875-125 MG PO TABS: 1.0000 | ORAL_TABLET | Freq: Two times a day (BID) | ORAL | 0 refills | Status: AC

## 2020-11-16 NOTE — Telephone Encounter (Signed)
Informed patient to use Dicyclomine as prescribed per [place], [place].  Patient voiced understanding.

## 2020-11-16 NOTE — Progress Notes (Signed)
Reviewed and agree with management plan.  Indi Willhite T. Hollie Wojahn, MD FACG [facility]  

## 2020-11-16 NOTE — Progress Notes (Signed)
11/16/2020 Derrick Wheeler [id number] Feb 06, 1971   HISTORY OF PRESENT ILLNESS: This is a 49 year old male who is a patient of Dr. Ardell Wheeler.  He had colonoscopy in June 2019 at which time he was found to have internal hemorrhoids and moderate diverticulosis in the left colon.  He is here today for follow-up of diverticulitis.  He says that he developed left lower quadrant abdominal pain on November 19.  Then on November 22 into November 23 it got very severe and he went to the emergency department.  CT scan of the abdomen and pelvis with contrast as follows:  IMPRESSION:   Mild acute diverticulitis of the proximal sigmoid colon, with trace surrounding fluid. Negative for drainable abscess or free air.   He was placed on Augmentin 875 mg twice daily.  He was also given Zofran for nausea, which does seem to help.  He also started himself on align probiotics.  He says that he is much improved compared to time of his ER visit, but he still gets sharp shooting pains down into his testicles at times.  He does have antispasmodic in the form of Bentyl at home, but has not used it.  Past Medical History:  Diagnosis Date  . Cervical spine fracture (HCC)   . Cholelithiasis   . Common migraine with intractable migraine 09/16/2015  . Compression fracture of L1 lumbar vertebra (HCC)   . GERD (gastroesophageal reflux disease)   . Hiatal hernia   . Hyperlipidemia   . Hypoglycemia   . Kidney infection   . Scoliosis   . Vertigo 05/31/2015   Past Surgical History:  Procedure Laterality Date  . arthrscopic knee surgery x8    . CHOLECYSTECTOMY    . HERNIA REPAIR    . KIDNEY STONE SURGERY    . KNEE SURGERY    . left wrist repair fracture x2    . lumbar fracture repair x6    . MOUTH SURGERY  08/2015  . Removal of testicular mass      reports that he quit smoking about 3 years ago. His smoking use included cigarettes. He has a 5.00 pack-year smoking history. He has never used smokeless tobacco. He  reports that he does not drink alcohol and does not use drugs. family history includes Arthritis in his mother; Asthma in his mother; Cancer in his maternal grandfather; Heart disease in his father; Stroke in his mother. Allergies  Allergen Reactions  . Meloxicam Shortness Of Breath and Rash  . Tramadol     Syncope   . Other     Bee sting  . Codeine Rash  . Keflex [Cephalexin] Rash  . Sulfa Antibiotics Rash  . Sulfacetamide Sodium Rash      Outpatient Encounter Medications as of 11/16/2020  Medication Sig  . OVER THE COUNTER MEDICATION Allergy relief . One daily as needed  . Probiotic Product (ALIGN) 4 MG CAPS Take 1 capsule by mouth daily.  . Multiple Vitamin (MULTIVITAMIN WITH MINERALS) TABS tablet Take 1 tablet by mouth daily. Reported on 01/27/2016  . pantoprazole (PROTONIX) 40 MG tablet Take 1 tablet (40 mg total) by mouth 2 (two) times daily.   No facility-administered encounter medications on file as of 11/16/2020.     REVIEW OF SYSTEMS  : All other systems reviewed and negative except where noted in the History of Present Illness.   PHYSICAL EXAM: BP 130/70   Pulse 93   Ht 6\' 2"  (1.88 m)   Wt 248 lb (112.5  kg)   BMI 31.84 kg/m  General: Well developed white male in no acute distress Head: Normocephalic and atraumatic Eyes:  Sclerae anicteric, conjunctiva pink. Ears: Normal auditory acuity Lungs: Clear throughout to auscultation; no W/R/R. Heart: Regular rate and rhythm; no M/R/G. Abdomen: Soft, non-distended.  BS present.  LLQ and suprapubic TTP. Musculoskeletal: Symmetrical with no gross deformities  Skin: No lesions on visible extremities Extremities: No edema  Neurological: Alert oriented x 4, grossly non-focal Psychological:  Alert and cooperative. Normal mood and affect  ASSESSMENT AND PLAN: *Sigmoid diverticulitis, uncomplicated on CT scan last week: Symptoms are improved from that point, but still has intermittent sharp pain.  He is on Augmentin twice  daily.  I am going to extend his course so that he has a total of 14 days.  He has dicyclomine at home and I have asked him to begin using that twice daily regularly.  I have asked him to call us back later on this week with an update again on his symptoms.  May need to switch antibiotics to Cipro/Flagyl.  If pain worsens or certainly if it persists despite a full course of treatment then he may need repeat CT scan to be sure there is no complications such as abscess formation, etc.   CC:  Derrick Wheeler, [facility] *

## 2020-11-16 NOTE — Patient Instructions (Addendum)
If you are age 50 or older, your body mass index should be between 23-30. Your Body mass index is 31.84 kg/m. If this is out of the aforementioned range listed, please consider follow up with your Primary Care Provider.  If you are age 46 or younger, your body mass index should be between 19-25. Your Body mass index is 31.84 kg/m. If this is out of the aformentioned range listed, please consider follow up with your Primary Care Provider.   Call back today to let us know if you have Bentyl/Dicyclomine at home.  Continue Align probiotic.   Extended Augmentin for 4 more days.  Call back on Thursday with an update on symptoms ask for Hilma Favors, RN.

## 2020-11-18 ENCOUNTER — Telehealth: Payer: Self-pay | Admitting: Gastroenterology

## 2020-11-18 NOTE — Telephone Encounter (Signed)
The pt is calling with a condition update.  Pain has been better since appt, he states the pain has lessened but he still has some discomfort.  He is going to finish the augmentin and call back if he does not continue to improve.  He was advised to call back if any if his symptoms return. The pt has been advised of the information and verbalized understanding.

## 2020-11-18 NOTE — Telephone Encounter (Signed)
Sounds great.  Thank you. 

## 2020-11-25 ENCOUNTER — Other Ambulatory Visit: Payer: Self-pay

## 2020-11-25 MED ORDER — METRONIDAZOLE 500 MG PO TABS: 500.0000 mg | ORAL_TABLET | Freq: Two times a day (BID) | ORAL | 0 refills | Status: AC

## 2020-11-25 MED ORDER — CIPROFLOXACIN HCL 500 MG PO TABS: 500.0000 mg | ORAL_TABLET | Freq: Two times a day (BID) | ORAL | 0 refills | Status: AC

## 2020-11-25 NOTE — Telephone Encounter (Signed)
Spoke with patient, he states that he is out of his pain medication that was prescribed at the hospital (Hydrocodone-acetaminophen 5-325 mg) he states that he made the 3 day supply last longer than it was supposed to. Advised patient that we usually do not refill pain medications, advised patient that he will need to contact his PCP to see if they would refill it, he states "I know they won't". Advised that once he started the antibiotics the pain should hopefully subside, advised to take Tylenol in the interim, pt insisted that I ask the provider anyway. Please advise, thank you.   Prescriptions sent to pharmacy.

## 2020-11-25 NOTE — Telephone Encounter (Signed)
History of Diverticulitis.  Spoke with patient, he states that he finished Augmentin prescription on Sunday morning, he states that he started feeling better, Tuesday he states that he had 0 pain. Pt reports that his LLQ abdominal pain started up again about 2:30/3 this morning, he states that the pain is not as bad as it was but it is starting to hurt again. Patient's temp is 97.4 at this moment, he states that he took his temperature 3 times yesterday and did not have a fever.   Dr. Russella Dar, in Jessica's absence. Please advise, thank you.

## 2020-11-25 NOTE — Telephone Encounter (Signed)
Extra Strength Tylenol 1-2 po qid prn pain Dicyclomine 10 mg po tid prn abdominal pain, #30, no refills We can't provide hydrocodone over the phone

## 2020-11-25 NOTE — Telephone Encounter (Signed)
Pt called to update [name] on his progress as he finished treatment.

## 2020-11-25 NOTE — Telephone Encounter (Signed)
Spoke with patient, advised on recommendations per Dr. Russella Dar. Pt states he has a prescription for Dicyclomine 20 mg, advised that he begin that if he has not already started and take as directed on the prescription. Advised that he can also take Extra strength Tylenol 1-2 qid prn pain. Advised that he will need to complete the full course of antibiotics even if he begins to feel better, pt will call us with an update. Advised patient that he can continue Align probiotic while on antibiotics. Pt states that he went back to a clear liquid diet. Pt had questions and concerns regarding his kidney stones, advised that he will need to follow up with his Urologist regarding this matter. Patient verbalized understanding of all information and had no concerns at the end of the call.

## 2020-11-25 NOTE — Telephone Encounter (Signed)
Flagyl 500 mg po bid for 10 days Cipro 500 mg po bid for 10 days

## 2021-07-21 ENCOUNTER — Encounter: Payer: Self-pay | Admitting: Nurse Practitioner

## 2021-07-21 ENCOUNTER — Ambulatory Visit (INDEPENDENT_AMBULATORY_CARE_PROVIDER_SITE_OTHER): Payer: BC Managed Care – PPO | Admitting: Nurse Practitioner

## 2021-07-21 VITALS — BP 124/74 | HR 89 | Ht 74.0 in | Wt 233.0 lb

## 2021-07-21 DIAGNOSIS — K219 Gastro-esophageal reflux disease without esophagitis: Secondary | ICD-10-CM

## 2021-07-21 DIAGNOSIS — K5900 Constipation, unspecified: Secondary | ICD-10-CM | POA: Diagnosis not present

## 2021-07-21 NOTE — Progress Notes (Signed)
ASSESSMENT AND PLAN    # 50 yo male with a history of recurrent, uncomplicated sigmoid diverticulitis (last episode late April 2022 ).   --I explained that we generally do not ask patients to avoid food nuts/seeds.  However it sounds like he has made a correlation between strawberries and sesame seeds and diverticulitis so he is avoiding those foods.  --For any recurrent diverticulitis symptoms patient will immediately put himself on a clear liquid diet and call our office --Follow-up as needed  # Constipation, new for patient.  He typically tends towards loose stool since cholecystectomy.  Constipation may be due in part to his reduction of fiber intake.  He read fiber was not good for diverticulitis.  -- I explained that during a flare of diverticulitis a low fiber diet is preferable. .  Otherwise he needs a high-fiber diet.  He will try and incorporate more fiber into his diet.  In the meantime he was started fiber supplement.  Since stools are hard I also recommended 2 stool softeners at bedtime   # GERD, no Barrett's on EGD in 2016 . Occasional symptoms which he can correlate with consumption of trigger foods ,  eating late or forgetting to take PPI.  He also takes PPI at random times --Anti-reflux meaures discussed --Recommend changing pantoprazole to 30 minutes before meals --Given the dietary changes and weight loss he may at some point be able to reduce dosage of PPI and for discontinue it altogether  # Hepatic steatosis on CT scan.  Hepatic function panel 05/12/2021 ([facility]).   --His dietary changes /weight loss should be beneficial  #Colon cancer screening.  He is up-to-date on screening colonoscopy, last one in 2019.  No polyps or cancers   HISTORY OF PRESENT ILLNESS    Chief Complaint : Follow-up on diverticulitis, GERD  Derrick Wheeler is a 50 y.o. male known to Dr. Russella Dar  with a past medical history significant for GERD, diverticulitis,  hemorrhoids, nephrolithiasis,  hyperlipidemia, COPD xraycholelithiasis status postcholecystectomy.  see PMH below for any additional medical problems.     Patient has a history of recurrent sigmoid diverticulitis all documented on CT scans ( [facility])  in April 2019, November 2021 and late April 2022.  Due to recurrent diverticulitis,  kidney stones and GERD patient has recently made some major lifestyle changes.  He has been able to lose ~ 30 pounds with intermittent fasting, dietary changes and hiking. He has stopped drinking soda and coffee.  One of his episodes of diverticulitis occurred after eating sesame seeds, the other after eating strawberries so he is avoiding both of those foods now.  He read that fiber was bad for diverticulitis so he reduced fiber intake which has now led to some constipation.  The constipation is new as patient was prone to loose stool since his cholecystectomy.  He is still having daily bowel movements but stools are often hard and associated with generalized abdominal pain  GERD - he has ocasional heartburn but it generally occurs if he eats late, forgets to take Pantoprazole or eats foods which he knows will trigger symptoms.    PREVIOUS ENDOSCOPIC EVALUATIONS / PERTINENT STUDIES:   April 2019 CTAP w/ contrast ( [facility]) --Mild wall thickening involving the sigmoid colon with diverticulosis possibly early acute sigmoid diverticulitis.  No abscess  November 2021 CTAP with contrast --Mild acute diverticulitis of the proximal sigmoid colon with trace surrounding fluid.  No abscess or free air  04/13/2021 CTAP with contrast --  Acute diverticulitis of the sigmoid colon with wall thickening and mild pericolonic stranding without abscess or free air   June 2019 colonoscopy with random biopsies The examined portion of the ileum was normal. - Moderate diverticulosis in the left colon. - Small internal hemorrhoids. - The examination was otherwise normal on direct and retroflexion views. Random biopsies  obtained  Surgical [P], random sites - COLONIC MUCOSA WITH NO SPECIFIC HISTOPATHOLOGIC CHANGES. - NEGATIVE FOR ACUTE INFLAMMATION, INCREASED INTRAEPITHELIAL LYMPHOCYTES OR THICKENED SUBEPITHELIAL COLLAGEN TABLE.    Past Medical History:  Diagnosis Date   Anxiety    Cervical spine fracture (HCC)    Cholelithiasis    Common migraine with intractable migraine 09/16/2015   Compression fracture of L1 lumbar vertebra (HCC)    Gallstones    Gastric ulcer    GERD (gastroesophageal reflux disease)    Hiatal hernia    Hx of diverticulitis of colon    recurrent   Hyperlipidemia    Hypoglycemia    IBS (irritable bowel syndrome)    Kidney infection    Pulmonary nodules 09/2017   Scoliosis    Vertigo 05/31/2015    Current Medications, Allergies, Past Surgical History, Family History and Social History were reviewed in [employer] record.   Current Outpatient Medications  Medication Sig Dispense Refill   albuterol (VENTOLIN HFA) 108 (90 Base) MCG/ACT inhaler Inhale into the lungs.     Multiple Vitamin (MULTIVITAMIN WITH MINERALS) TABS tablet Take 1 tablet by mouth daily. Reported on 01/27/2016     ondansetron (ZOFRAN-ODT) 4 MG disintegrating tablet Take 1 tablet by mouth every 8 (eight) hours as needed.     OVER THE COUNTER MEDICATION Allergy relief . One tablet twice daily     pantoprazole (PROTONIX) 40 MG tablet Take 1 tablet (40 mg total) by mouth 2 (two) times daily. (Patient taking differently: Take 40 mg by mouth daily.) 60 tablet 11   Probiotic Product (ALIGN) 4 MG CAPS Take 1 capsule by mouth daily as needed.     No current facility-administered medications for this visit.    Review of Systems: Positive for diminished hearing . No chest pain. No shortness of breath. No urinary complaints.   PHYSICAL EXAM :    Wt Readings from Last 3 Encounters:  07/21/21 233 lb (105.7 kg)  11/16/20 248 lb (112.5 kg)  06/16/19 230 lb (104.3 kg)    BP 124/74   Pulse  89   Ht 6\' 2"  (1.88 m)   Wt 233 lb (105.7 kg)   SpO2 98%   BMI 29.92 kg/m  Constitutional:  Pleasant male in no acute distress. Psychiatric: Normal mood and affect. Behavior is normal. EENT: Pupils normal.  Conjunctivae are normal. No scleral icterus. Neck supple.  Cardiovascular: Normal rate, regular rhythm. No edema Pulmonary/chest: Effort normal and breath sounds normal. No wheezing, rales or rhonchi. Abdominal: Soft, nondistended, nontender. Bowel sounds active throughout. There are no masses palpable. No hepatomegaly. Neurological: Alert and oriented to person place and time. Skin: Skin is warm and dry. No rashes noted.  I spent 30 minutes total reviewing records, obtaining history, performing exam, counseling patient and documenting visit / findings.    , NP  07/21/2021, 12:08 PM

## 2021-07-21 NOTE — Patient Instructions (Signed)
If you are age 51 or older, your body mass index should be between 23-30. Your Body mass index is 29.92 kg/m. If this is out of the aforementioned range listed, please consider follow up with your Primary Care Provider.  If you are age 81 or younger, your body mass index should be between 19-25. Your Body mass index is 29.92 kg/m. If this is out of the aformentioned range listed, please consider follow up with your Primary Care Provider.   __________________________________________________________  The [facility] providers would like to encourage you to use [facility] to communicate with providers for non-urgent requests or questions.  Due to long hold times on the telephone, sending your provider a message by [facility] may be a faster and more efficient way to get a response.  Please allow 48 business hours for a response.  Please remember that this is for non-urgent requests.   Take 2 stool softeners daily at bedtime.  Daily fiber capsules as directed on the bottle.  We are giving you a high fiber diet to follow.  Follow up as needed.  Great job on weight loss!  Thank you for entrusting me with your care and for choosing [facility], Willette Cluster, NP-C

## 2021-07-21 NOTE — Progress Notes (Signed)
Reviewed and agree with management plan.  Margareta Laureano T. Cambridge Deleo, MD FACG 

## 2021-09-05 ENCOUNTER — Telehealth: Payer: Self-pay | Admitting: Nurse Practitioner

## 2021-09-05 ENCOUNTER — Other Ambulatory Visit: Payer: Self-pay

## 2021-09-05 MED ORDER — CIPROFLOXACIN HCL 500 MG PO TABS: 500.0000 mg | ORAL_TABLET | Freq: Two times a day (BID) | ORAL | 0 refills | Status: AC

## 2021-09-05 MED ORDER — METRONIDAZOLE 250 MG PO TABS: 250.0000 mg | ORAL_TABLET | Freq: Three times a day (TID) | ORAL | 0 refills | Status: AC

## 2021-09-05 NOTE — Telephone Encounter (Signed)
Bland, low residue diet Focus on hydration Cipro 500 mg twice daily, metronidazole 250 mg 3 times daily both x7 days To the ER if acutely worsening He should let us know if he fails to improve

## 2021-09-05 NOTE — Telephone Encounter (Signed)
Patient instructed. Rx to his pharmacy. Questions answered.

## 2021-09-05 NOTE — Telephone Encounter (Signed)
DOD Patient of Dr Russella Dar, last seen by Willette Cluster, NP. History of diverticulitis, last time in April of this year. He is calling with complaints of worsening LLQ pain "in the same place as last time I had diverticulitis." He denies any fever. He has a feeling of constipation but reports he has had a bowel movement today. A little nausea without vomiting. He has started liquid diet. Allergic to Keflex. Please advise.

## 2021-09-05 NOTE — Telephone Encounter (Signed)
Inbound call from patient. Is having a flare of diverticulitis. Pain in lower left abd area that began 9/18. Would like something called to pharmacy to help. Best contact number [phone]

## 2021-09-13 ENCOUNTER — Other Ambulatory Visit: Payer: Self-pay

## 2021-09-13 MED ORDER — AMOXICILLIN-POT CLAVULANATE 875-125 MG PO TABS: 1.0000 | ORAL_TABLET | Freq: Two times a day (BID) | ORAL | 0 refills | Status: AC

## 2021-09-13 NOTE — Telephone Encounter (Signed)
Start Augmentin 875 mg po bid with food, #14, no refills Stop Cipro and Flagyl Call or return to ED if symptoms worsen or do not resolve.

## 2021-09-13 NOTE — Telephone Encounter (Signed)
Patient called back said he is still not feeling well and is wondering if he can get more Cipro to help please call patient and advise.

## 2021-09-13 NOTE — Telephone Encounter (Signed)
Patient instructed. Rx to [facility] per request.

## 2021-09-13 NOTE — Telephone Encounter (Signed)
Spoke with the patient. He did go to the ED after a couple of days as he was advised to do. He was on the antibiotics as directed, low residue diet (mainly soups and fluids) but the pain worsened and he developed a fever. The ED did a CT scan. No change of the treatment. He has been off the antibiotics 24 hours. Describes the pain as dull in nature and it comes and goes. Afebrile and no nausea. He is concerned because of the lingering discomfort. Would like to extend the antibiotics for a couple of days if you agree.

## 2021-09-19 ENCOUNTER — Other Ambulatory Visit: Payer: Self-pay

## 2021-09-19 MED ORDER — DICYCLOMINE HCL 10 MG PO CAPS: 10.0000 mg | ORAL_CAPSULE | Freq: Three times a day (TID) | ORAL | 0 refills | Status: DC

## 2021-09-19 MED ORDER — SACCHAROMYCES BOULARDII 250 MG PO CAPS: 250.0000 mg | ORAL_CAPSULE | Freq: Two times a day (BID) | ORAL | 0 refills | Status: AC

## 2021-09-19 NOTE — Telephone Encounter (Signed)
Patient is instructed. Rx to [facility] in [place].

## 2021-09-19 NOTE — Telephone Encounter (Signed)
Begin Florastor bid for 2 weeks Dicyclomine 10 mg po tid, #30, no refills Low fat, low fiber, lactose free, no raw fruits, no raw vegetables diet until evaluated by JL Keep appt for JL in 2 days Present to ED if symptoms worsen

## 2021-09-19 NOTE — Telephone Encounter (Signed)
Inbound call from patient. States he is not doing any better. Says he is actually doing worse than last week.

## 2021-09-19 NOTE — Telephone Encounter (Addendum)
Spoke with the patient. He has been taking the Augmentin. Now has diarrhea. He complains of continued LLQ pain. He has taken a "pain pill" because he was so uncomfortable. Did not sleep well due to his pain. Afebrile. No vomiting. He has maintained a soft diet except for eating Reeses Pieces a few days again. He said the pain in not as intense as it was when he went to the ED on 09/06/21. I have found an appointment for him in 2 days with Hyacinth Meeker, PA. No earlier openings on APP schedules. Please advise.

## 2021-09-21 ENCOUNTER — Encounter: Payer: Self-pay | Admitting: Physician Assistant

## 2021-09-21 ENCOUNTER — Ambulatory Visit (INDEPENDENT_AMBULATORY_CARE_PROVIDER_SITE_OTHER): Payer: BC Managed Care – PPO | Admitting: Physician Assistant

## 2021-09-21 VITALS — BP 110/80 | HR 95 | Ht 74.0 in | Wt 236.1 lb

## 2021-09-21 DIAGNOSIS — K5732 Diverticulitis of large intestine without perforation or abscess without bleeding: Secondary | ICD-10-CM

## 2021-09-21 MED ORDER — DICYCLOMINE HCL 10 MG PO CAPS: 10.0000 mg | ORAL_CAPSULE | Freq: Three times a day (TID) | ORAL | 0 refills | Status: DC

## 2021-09-21 MED ORDER — ONDANSETRON 4 MG PO TBDP: 4.0000 mg | ORAL_TABLET | Freq: Three times a day (TID) | ORAL | 1 refills | Status: AC | PRN

## 2021-09-21 NOTE — Progress Notes (Signed)
Chief Complaint: Diverticulitis  HPI:    Derrick Wheeler is a 50 year old male with a past medical history of reflux and others listed below, known to Dr. Russella Dar, who presents to clinic today with a complaint of diverticulitis.    07/21/2021 patient seen in clinic by Willette Cluster and discussed history of recurrent sigmoid diverticulitis all documented on CT scans, April 2019, November 2021 in late April 2022.  Also discussed some reflux.  At that time discussed high-fiber diet for constipation.  Reviewed EGD in 2016 with no Barrett's.  He was changed to Pantoprazole 30 minutes before meals.  Discussed hepatic steatosis on CT scan.  Explained that weight loss and dietary changes were beneficial.  He was up-to-date on colon cancer screening with his last colonoscopy in 2019.    09/05/2021 patient called in with a flare of diverticulitis.  He was told to be on a bland/low residue diet and given Cipro 500 twice daily and Metronidazole 250 3 times daily x7 days.  He continues with pain after treatment and was given Augmentin 875 mg p.o. twice daily with food for 7 days.  Told to stop his Cipro and Flagyl.    09/06/2021 ED visit for left lower quadrant pain with nausea and vomiting.  At that time CT abdomen pelvis with contrast showed mild diffuse gastric wall thickening.  Colonic diverticulosis with mild induration along the superior margin of the proximal sigmoid colon.  He was given hydrocodone.    09/13/2021 patient called describing is not doing any better in fact he was doing worse.  Described that he had been taking his Augmentin but now had diarrhea and continued left lower quadrant pain.  At that time advised to start Florastor twice daily for 2 weeks and Dicyclomine 10 mg p.o. 3 times daily.    Today, patient explains that he has had a lot of improvement since calling into our clinic on Monday, 09/19/2021.  Explains that he has started taking the Dicyclomine 10 mg 3 times a day and the Florastor twice a day  and went from having 12-15 liquid bowel movements a day down to 1-2 and his pain went from a 3.5/10 down to a 0 /10 currently.  Occasionally he will still feel twinges here and there.  Is maintaining a mostly bland diet with soups.  Tells me he overall feels a lot better, still occasionally gets waves of nausea and would like a refill of Zofran.  Does ask about what to do going forward.    Denies fever, chills, blood in the stool or symptoms that awaken him from sleep  Past Medical History:  Diagnosis Date   Anxiety    Cervical spine fracture (HCC)    Cholelithiasis    Common migraine with intractable migraine 09/16/2015   Compression fracture of L1 lumbar vertebra (HCC)    Gallstones    Gastric ulcer    GERD (gastroesophageal reflux disease)    Hiatal hernia    Hx of diverticulitis of colon    recurrent   Hyperlipidemia    Hypoglycemia    IBS (irritable bowel syndrome)    Kidney infection    Pulmonary nodules 09/2017   Scoliosis    Vertigo 05/31/2015    Past Surgical History:  Procedure Laterality Date   arthrscopic knee surgery x8     CHOLECYSTECTOMY     HERNIA REPAIR     KIDNEY STONE SURGERY     KNEE SURGERY     left wrist repair fracture x2  lumbar fracture repair x6     MOUTH SURGERY  08/2015   Removal of testicular mass      Current Outpatient Medications  Medication Sig Dispense Refill   albuterol (VENTOLIN HFA) 108 (90 Base) MCG/ACT inhaler Inhale into the lungs.     dicyclomine (BENTYL) 10 MG capsule Take 1 capsule (10 mg total) by mouth 3 (three) times daily. 30 capsule 0   Docusate Calcium (STOOL SOFTENER PO) Take 2 capsules by mouth at bedtime.     Inulin (FIBER CHOICE FRUITY BITES) 1.5 g CHEW Chew by mouth daily.     Multiple Vitamin (MULTIVITAMIN WITH MINERALS) TABS tablet Take 1 tablet by mouth daily. Reported on 01/27/2016     ondansetron (ZOFRAN-ODT) 4 MG disintegrating tablet Take 1 tablet by mouth every 8 (eight) hours as needed.     OVER THE COUNTER  MEDICATION Allergy relief . One tablet twice daily     pantoprazole (PROTONIX) 40 MG tablet Take 1 tablet (40 mg total) by mouth 2 (two) times daily. (Patient taking differently: Take 40 mg by mouth daily.) 60 tablet 11   Probiotic Product (ALIGN) 4 MG CAPS Take 1 capsule by mouth daily as needed.     saccharomyces boulardii (FLORASTOR) 250 MG capsule Take 1 capsule (250 mg total) by mouth 2 (two) times daily for 14 days. 28 capsule 0   No current facility-administered medications for this visit.    Allergies as of 09/21/2021 - Review Complete 07/21/2021  Allergen Reaction Noted   Meloxicam Shortness Of Breath and Rash 02/17/2015   Tramadol  06/11/2018   Other  05/31/2015   Codeine Rash 03/18/2013   Dilaudid [hydromorphone] Anxiety 07/20/2021   Keflex [cephalexin] Rash 02/09/2016   Sulfa antibiotics Rash 01/17/2014   Sulfacetamide sodium Rash 04/15/2016    Family History  Problem Relation Age of Onset   Asthma Mother    Arthritis Mother    Stroke Mother    Heart disease Father    Cancer Maternal Grandfather    Colon cancer Neg Hx    Esophageal cancer Neg Hx    Rectal cancer Neg Hx    Stomach cancer Neg Hx     Social History   Socioeconomic History   Marital status: Single    Spouse name: Not on file   Number of children: 1   Years of education: 14   Highest education level: Not on file  Occupational History   Occupation: Curator  Tobacco Use   Smoking status: Former    Packs/day: 0.50    Years: 10.00    Pack years: 5.00    Types: Cigarettes    Quit date: 06/17/2017    Years since quitting: 4.2   Smokeless tobacco: Never  Vaping Use   Vaping Use: Never used  Substance and Sexual Activity   Alcohol use: No    Comment: rare   Drug use: No   Sexual activity: Not on file  Other Topics Concern   Not on file  Social History Narrative   Patient drinks caffeine occasionally.   Patient is right handed.   Social Determinants of Health   Financial Resource  Strain: Not on file  Food Insecurity: Not on file  Transportation Needs: Not on file  Physical Activity: Not on file  Stress: Not on file  Social Connections: Not on file  Intimate Partner Violence: Not on file    Review of Systems:    Constitutional: No fever or chills Cardiovascular: No chest pain  Respiratory:  No SOB  Gastrointestinal: See HPI and otherwise negative   Physical Exam:  Vital signs: BP 110/80   Pulse 95   Ht 6\' 2"  (1.88 m)   Wt 236 lb 2 oz (107.1 kg)   SpO2 97%   BMI 30.32 kg/m    Constitutional:   Pleasant Caucasian male appears to be in NAD, Well developed, Well nourished, alert and cooperative Respiratory: Respirations even and unlabored. Lungs clear to auscultation bilaterally.   No wheezes, crackles, or rhonchi.  Cardiovascular: Normal S1, S2. No MRG. Regular rate and rhythm. No peripheral edema, cyanosis or pallor.  Gastrointestinal:  Soft, nondistended, nontender. No rebound or guarding. Normal bowel sounds. No appreciable masses or hepatomegaly. Rectal:  Not performed.  Psychiatric: Oriented to person, place and time. Demonstrates good judgement and reason without abnormal affect or behaviors.  See HPI for recent labs /imaging.  Assessment: 1.  Recurrent diverticulitis: Most recent episode started 9/19 and continued into 10/2, did go to the ER and had a CT showing likely developing diverticulitis, treated with Cipro/Flagyl x7 days and then Augmentin, as of today and yesterday feeling better after adding in Dicyclomine 10 mg 3 times daily and Florastor twice daily  Plan: 1.  At this time recommend the patient continue his Dicyclomine 3 times daily for another month.  Ensured he had refills. 2.  Recommend he continue Florastor twice daily for the next month. 3.  Recommend he stay on a low fiber/low residue diet while still having twinges of left lower quadrant pain.   I would say about another week and then start increasing to a high-fiber diet.   Provided him with a handout. 4.  Refilled Zofran 4 mg sublingual tabs every 4-6 hours as needed for nausea #15 with 1 refill. 5.  Encouraged patient to stay well-hydrated. 6.  Patient to follow in clinic with 12/2 as needed.  [place], PA-C [facility] 09/21/2021, 2:33 PM  Cc: 11/21/2021, NP

## 2021-09-21 NOTE — Progress Notes (Signed)
Reviewed and agree with management plan.  Makenzi Bannister T. Meldrick Buttery, MD FACG 

## 2021-09-21 NOTE — Patient Instructions (Signed)
We have sent the following medications to your pharmacy for you to pick up at your convenience: Zofran  & Dicyclomine.  Continue Dicyclomine and Florastor for a month.   Follow up as needed.  If you are age 51 or older, your body mass index should be between 23-30. Your Body mass index is 30.32 kg/m. If this is out of the aforementioned range listed, please consider follow up with your Primary Care Provider.  If you are age 30 or younger, your body mass index should be between 19-25. Your Body mass index is 30.32 kg/m. If this is out of the aformentioned range listed, please consider follow up with your Primary Care Provider.   __________________________________________________________  The [facility] providers would like to encourage you to use [facility] to communicate with providers for non-urgent requests or questions.  Due to long hold times on the telephone, sending your provider a message by [facility] may be a faster and more efficient way to get a response.  Please allow 48 business hours for a response.  Please remember that this is for non-urgent requests.

## 2021-09-22 ENCOUNTER — Ambulatory Visit: Payer: BC Managed Care – PPO | Admitting: Gastroenterology

## 2021-10-22 ENCOUNTER — Other Ambulatory Visit: Payer: Self-pay | Admitting: Physician Assistant

## 2021-11-06 ENCOUNTER — Other Ambulatory Visit: Payer: Self-pay | Admitting: Physician Assistant

## 2022-05-18 DEATH — deceased
